# Patient Record
Sex: Male | Born: 1935 | Race: White | Hispanic: No | Smoking: Former smoker
Health system: Southern US, Community
[De-identification: ages and names within clinical notes are randomized; demographics above are authoritative.]

## PROBLEM LIST (undated history)

## (undated) DIAGNOSIS — I10 Essential (primary) hypertension: Secondary | ICD-10-CM

## (undated) DIAGNOSIS — E785 Hyperlipidemia, unspecified: Secondary | ICD-10-CM

## (undated) DIAGNOSIS — C61 Malignant neoplasm of prostate: Secondary | ICD-10-CM

## (undated) DIAGNOSIS — Z87442 Personal history of urinary calculi: Secondary | ICD-10-CM

## (undated) DIAGNOSIS — M1990 Unspecified osteoarthritis, unspecified site: Secondary | ICD-10-CM

## (undated) HISTORY — DX: Malignant neoplasm of prostate: C61

## (undated) HISTORY — DX: Hyperlipidemia, unspecified: E78.5

## (undated) HISTORY — DX: Unspecified osteoarthritis, unspecified site: M19.90

## (undated) HISTORY — DX: Essential (primary) hypertension: I10

---
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
## 2005-01-18 ENCOUNTER — Ambulatory Visit (HOSPITAL_COMMUNITY): Admission: RE | Admit: 2005-01-18 | Discharge: 2005-01-18 | Payer: Self-pay | Admitting: Ophthalmology

## 2005-03-01 ENCOUNTER — Ambulatory Visit (HOSPITAL_COMMUNITY): Admission: RE | Admit: 2005-03-01 | Discharge: 2005-03-01 | Payer: Self-pay | Admitting: Ophthalmology

## 2008-01-05 ENCOUNTER — Ambulatory Visit: Payer: Self-pay | Admitting: Internal Medicine

## 2008-01-30 ENCOUNTER — Encounter: Payer: Self-pay | Admitting: Internal Medicine

## 2008-01-30 ENCOUNTER — Ambulatory Visit: Payer: Self-pay | Admitting: Internal Medicine

## 2008-02-02 ENCOUNTER — Encounter: Payer: Self-pay | Admitting: Internal Medicine

## 2008-04-27 ENCOUNTER — Ambulatory Visit (HOSPITAL_COMMUNITY): Admission: RE | Admit: 2008-04-27 | Discharge: 2008-04-27 | Payer: Self-pay | Admitting: Urology

## 2008-05-12 ENCOUNTER — Ambulatory Visit: Admission: RE | Admit: 2008-05-12 | Discharge: 2008-08-03 | Payer: Self-pay | Admitting: Radiation Oncology

## 2008-08-03 ENCOUNTER — Ambulatory Visit: Admission: RE | Admit: 2008-08-03 | Discharge: 2008-10-27 | Payer: Self-pay | Admitting: Radiation Oncology

## 2009-02-19 DIAGNOSIS — C61 Malignant neoplasm of prostate: Secondary | ICD-10-CM

## 2009-02-19 HISTORY — PX: CARPAL TUNNEL RELEASE: SHX101

## 2009-02-19 HISTORY — DX: Malignant neoplasm of prostate: C61

## 2010-02-19 HISTORY — PX: COLONOSCOPY W/ BIOPSIES AND POLYPECTOMY: SHX1376

## 2010-04-21 ENCOUNTER — Encounter (INDEPENDENT_AMBULATORY_CARE_PROVIDER_SITE_OTHER): Payer: Self-pay | Admitting: *Deleted

## 2010-04-27 NOTE — Letter (Signed)
Summary: Pre Visit Letter Revised  [facility]  [address] [place], [place] [address]   Phone: [phone]  Fax: [phone]        04/21/2010 MRN: [id number] Levi Cervantes [address] [place], [place]  [address]             Procedure Date:  June 02, 2010   recall col Dr Marina Goodell  Welcome to the Gastroenterology Division at [facility].    You are scheduled to see a nurse for your pre-procedure visit on May 19, 2010 at 8:30am on the 3rd floor at [facility], [address]  We ask that you try to arrive at our office 15 minutes prior to your appointment time to allow for check-in.  Please take a minute to review the attached form.  If you answer "Yes" to one or more of the questions on the first page, we ask that you call the person listed at your earliest opportunity.  If you answer "No" to all of the questions, please complete the rest of the form and bring it to your appointment.    Your nurse visit will consist of discussing your medical and surgical history, your immediate family medical history, and your medications.   If you are unable to list all of your medications on the form, please bring the medication bottles to your appointment and we will list them.  We will need to be aware of both prescribed and over the counter drugs.  We will need to know exact dosage information as well.    Please be prepared to read and sign documents such as consent forms, a financial agreement, and acknowledgement forms.  If necessary, and with your consent, a friend or relative is welcome to sit-in on the nurse visit with you.  Please bring your insurance card so that we may make a copy of it.  If your insurance requires a referral to see a specialist, please bring your referral form from your primary care physician.  No co-pay is required for this nurse visit.     If you cannot keep your appointment, please call [phone] to cancel or reschedule prior to  your appointment date.  This allows [place] the opportunity to schedule an appointment for another patient in need of care.    Thank you for choosing [facility] for your medical needs.  We appreciate the opportunity to care for you.  Please visit [place] at our website  to learn more about our practice.  Sincerely, The Gastroenterology Division

## 2010-05-19 ENCOUNTER — Ambulatory Visit (AMBULATORY_SURGERY_CENTER): Payer: Medicare Other | Admitting: *Deleted

## 2010-05-19 VITALS — Ht 70.5 in | Wt 198.0 lb

## 2010-05-19 DIAGNOSIS — Z86010 Personal history of colonic polyps: Secondary | ICD-10-CM

## 2010-05-19 MED ORDER — PEG-KCL-NACL-NASULF-NA ASC-C 100 G PO SOLR: 1.0000 | Freq: Once | ORAL | Status: AC

## 2010-06-02 ENCOUNTER — Encounter: Payer: Self-pay | Admitting: Internal Medicine

## 2010-06-02 ENCOUNTER — Ambulatory Visit (AMBULATORY_SURGERY_CENTER): Payer: Medicare Other | Admitting: Internal Medicine

## 2010-06-02 VITALS — BP 142/74 | HR 49 | Temp 96.6°F | Resp 14 | Ht 70.0 in | Wt 190.0 lb

## 2010-06-02 DIAGNOSIS — D126 Benign neoplasm of colon, unspecified: Secondary | ICD-10-CM

## 2010-06-02 DIAGNOSIS — Z86010 Personal history of colonic polyps: Secondary | ICD-10-CM

## 2010-06-02 DIAGNOSIS — Z1211 Encounter for screening for malignant neoplasm of colon: Secondary | ICD-10-CM

## 2010-06-02 DIAGNOSIS — K5520 Angiodysplasia of colon without hemorrhage: Secondary | ICD-10-CM

## 2010-06-02 MED ORDER — SODIUM CHLORIDE 0.9 % IV SOLN: 500.0000 mL | INTRAVENOUS | Status: DC

## 2010-06-02 NOTE — Patient Instructions (Signed)
Resume medications. Follow discharge instructions. Next colonoscopy in 3 years.

## 2010-06-05 ENCOUNTER — Telehealth: Payer: Self-pay | Admitting: *Deleted

## 2010-06-05 NOTE — Telephone Encounter (Signed)

## 2011-04-12 DIAGNOSIS — Z125 Encounter for screening for malignant neoplasm of prostate: Secondary | ICD-10-CM | POA: Diagnosis not present

## 2011-04-12 DIAGNOSIS — E1129 Type 2 diabetes mellitus with other diabetic kidney complication: Secondary | ICD-10-CM | POA: Diagnosis not present

## 2011-04-12 DIAGNOSIS — E785 Hyperlipidemia, unspecified: Secondary | ICD-10-CM | POA: Diagnosis not present

## 2011-04-12 DIAGNOSIS — I10 Essential (primary) hypertension: Secondary | ICD-10-CM | POA: Diagnosis not present

## 2011-04-17 DIAGNOSIS — Z1212 Encounter for screening for malignant neoplasm of rectum: Secondary | ICD-10-CM | POA: Diagnosis not present

## 2011-04-19 DIAGNOSIS — E785 Hyperlipidemia, unspecified: Secondary | ICD-10-CM | POA: Diagnosis not present

## 2011-04-19 DIAGNOSIS — Z0000 Encounter for general adult medical examination without abnormal findings: Secondary | ICD-10-CM | POA: Diagnosis not present

## 2011-04-19 DIAGNOSIS — I10 Essential (primary) hypertension: Secondary | ICD-10-CM | POA: Diagnosis not present

## 2011-04-19 DIAGNOSIS — E1129 Type 2 diabetes mellitus with other diabetic kidney complication: Secondary | ICD-10-CM | POA: Diagnosis not present

## 2011-05-17 DIAGNOSIS — H259 Unspecified age-related cataract: Secondary | ICD-10-CM | POA: Diagnosis not present

## 2011-05-17 DIAGNOSIS — H40059 Ocular hypertension, unspecified eye: Secondary | ICD-10-CM | POA: Diagnosis not present

## 2011-05-17 DIAGNOSIS — D3130 Benign neoplasm of unspecified choroid: Secondary | ICD-10-CM | POA: Diagnosis not present

## 2011-07-02 DIAGNOSIS — N529 Male erectile dysfunction, unspecified: Secondary | ICD-10-CM | POA: Diagnosis not present

## 2011-07-02 DIAGNOSIS — C61 Malignant neoplasm of prostate: Secondary | ICD-10-CM | POA: Diagnosis not present

## 2011-10-01 DIAGNOSIS — C61 Malignant neoplasm of prostate: Secondary | ICD-10-CM | POA: Diagnosis not present

## 2011-10-01 DIAGNOSIS — N320 Bladder-neck obstruction: Secondary | ICD-10-CM | POA: Diagnosis not present

## 2011-10-17 DIAGNOSIS — E785 Hyperlipidemia, unspecified: Secondary | ICD-10-CM | POA: Diagnosis not present

## 2011-10-17 DIAGNOSIS — I10 Essential (primary) hypertension: Secondary | ICD-10-CM | POA: Diagnosis not present

## 2011-10-17 DIAGNOSIS — N182 Chronic kidney disease, stage 2 (mild): Secondary | ICD-10-CM | POA: Diagnosis not present

## 2011-10-17 DIAGNOSIS — E1129 Type 2 diabetes mellitus with other diabetic kidney complication: Secondary | ICD-10-CM | POA: Diagnosis not present

## 2011-11-15 DIAGNOSIS — H40019 Open angle with borderline findings, low risk, unspecified eye: Secondary | ICD-10-CM | POA: Diagnosis not present

## 2011-11-15 DIAGNOSIS — D3130 Benign neoplasm of unspecified choroid: Secondary | ICD-10-CM | POA: Diagnosis not present

## 2011-11-15 DIAGNOSIS — H40059 Ocular hypertension, unspecified eye: Secondary | ICD-10-CM | POA: Diagnosis not present

## 2011-11-15 DIAGNOSIS — H259 Unspecified age-related cataract: Secondary | ICD-10-CM | POA: Diagnosis not present

## 2011-11-27 DIAGNOSIS — E1129 Type 2 diabetes mellitus with other diabetic kidney complication: Secondary | ICD-10-CM | POA: Diagnosis not present

## 2011-11-27 DIAGNOSIS — I10 Essential (primary) hypertension: Secondary | ICD-10-CM | POA: Diagnosis not present

## 2011-12-31 DIAGNOSIS — R972 Elevated prostate specific antigen [PSA]: Secondary | ICD-10-CM | POA: Diagnosis not present

## 2012-04-15 DIAGNOSIS — E1129 Type 2 diabetes mellitus with other diabetic kidney complication: Secondary | ICD-10-CM | POA: Diagnosis not present

## 2012-04-15 DIAGNOSIS — E785 Hyperlipidemia, unspecified: Secondary | ICD-10-CM | POA: Diagnosis not present

## 2012-04-15 DIAGNOSIS — I10 Essential (primary) hypertension: Secondary | ICD-10-CM | POA: Diagnosis not present

## 2012-04-15 DIAGNOSIS — Z125 Encounter for screening for malignant neoplasm of prostate: Secondary | ICD-10-CM | POA: Diagnosis not present

## 2012-04-21 DIAGNOSIS — Z0000 Encounter for general adult medical examination without abnormal findings: Secondary | ICD-10-CM | POA: Diagnosis not present

## 2012-04-21 DIAGNOSIS — E1129 Type 2 diabetes mellitus with other diabetic kidney complication: Secondary | ICD-10-CM | POA: Diagnosis not present

## 2012-04-21 DIAGNOSIS — I10 Essential (primary) hypertension: Secondary | ICD-10-CM | POA: Diagnosis not present

## 2012-04-21 DIAGNOSIS — Z125 Encounter for screening for malignant neoplasm of prostate: Secondary | ICD-10-CM | POA: Diagnosis not present

## 2012-04-21 DIAGNOSIS — Z1331 Encounter for screening for depression: Secondary | ICD-10-CM | POA: Diagnosis not present

## 2012-04-23 DIAGNOSIS — Z1212 Encounter for screening for malignant neoplasm of rectum: Secondary | ICD-10-CM | POA: Diagnosis not present

## 2012-05-05 DIAGNOSIS — N529 Male erectile dysfunction, unspecified: Secondary | ICD-10-CM | POA: Diagnosis not present

## 2012-05-05 DIAGNOSIS — C61 Malignant neoplasm of prostate: Secondary | ICD-10-CM | POA: Diagnosis not present

## 2012-05-15 DIAGNOSIS — H40059 Ocular hypertension, unspecified eye: Secondary | ICD-10-CM | POA: Diagnosis not present

## 2012-05-15 DIAGNOSIS — H40019 Open angle with borderline findings, low risk, unspecified eye: Secondary | ICD-10-CM | POA: Diagnosis not present

## 2012-05-15 DIAGNOSIS — H259 Unspecified age-related cataract: Secondary | ICD-10-CM | POA: Diagnosis not present

## 2012-10-27 DIAGNOSIS — Z6831 Body mass index (BMI) 31.0-31.9, adult: Secondary | ICD-10-CM | POA: Diagnosis not present

## 2012-10-27 DIAGNOSIS — E785 Hyperlipidemia, unspecified: Secondary | ICD-10-CM | POA: Diagnosis not present

## 2012-10-27 DIAGNOSIS — E669 Obesity, unspecified: Secondary | ICD-10-CM | POA: Diagnosis not present

## 2012-10-27 DIAGNOSIS — N182 Chronic kidney disease, stage 2 (mild): Secondary | ICD-10-CM | POA: Diagnosis not present

## 2012-10-27 DIAGNOSIS — I10 Essential (primary) hypertension: Secondary | ICD-10-CM | POA: Diagnosis not present

## 2012-10-27 DIAGNOSIS — C61 Malignant neoplasm of prostate: Secondary | ICD-10-CM | POA: Diagnosis not present

## 2012-10-27 DIAGNOSIS — E1129 Type 2 diabetes mellitus with other diabetic kidney complication: Secondary | ICD-10-CM | POA: Diagnosis not present

## 2012-11-10 DIAGNOSIS — C61 Malignant neoplasm of prostate: Secondary | ICD-10-CM | POA: Diagnosis not present

## 2012-11-10 DIAGNOSIS — N320 Bladder-neck obstruction: Secondary | ICD-10-CM | POA: Diagnosis not present

## 2012-11-10 DIAGNOSIS — N529 Male erectile dysfunction, unspecified: Secondary | ICD-10-CM | POA: Diagnosis not present

## 2012-11-13 DIAGNOSIS — H40059 Ocular hypertension, unspecified eye: Secondary | ICD-10-CM | POA: Diagnosis not present

## 2012-11-13 DIAGNOSIS — H40019 Open angle with borderline findings, low risk, unspecified eye: Secondary | ICD-10-CM | POA: Diagnosis not present

## 2012-11-13 DIAGNOSIS — H259 Unspecified age-related cataract: Secondary | ICD-10-CM | POA: Diagnosis not present

## 2012-11-13 DIAGNOSIS — D3130 Benign neoplasm of unspecified choroid: Secondary | ICD-10-CM | POA: Diagnosis not present

## 2013-01-08 DIAGNOSIS — D3130 Benign neoplasm of unspecified choroid: Secondary | ICD-10-CM | POA: Diagnosis not present

## 2013-01-08 DIAGNOSIS — H4020X0 Unspecified primary angle-closure glaucoma, stage unspecified: Secondary | ICD-10-CM | POA: Diagnosis not present

## 2013-01-08 DIAGNOSIS — H409 Unspecified glaucoma: Secondary | ICD-10-CM | POA: Diagnosis not present

## 2013-01-08 DIAGNOSIS — H2510 Age-related nuclear cataract, unspecified eye: Secondary | ICD-10-CM | POA: Diagnosis not present

## 2013-05-07 DIAGNOSIS — H40249 Residual stage of angle-closure glaucoma, unspecified eye: Secondary | ICD-10-CM | POA: Diagnosis not present

## 2013-05-07 DIAGNOSIS — H259 Unspecified age-related cataract: Secondary | ICD-10-CM | POA: Diagnosis not present

## 2013-05-07 DIAGNOSIS — H5200 Hypermetropia, unspecified eye: Secondary | ICD-10-CM | POA: Diagnosis not present

## 2013-05-07 DIAGNOSIS — H40059 Ocular hypertension, unspecified eye: Secondary | ICD-10-CM | POA: Diagnosis not present

## 2013-05-08 DIAGNOSIS — C61 Malignant neoplasm of prostate: Secondary | ICD-10-CM | POA: Diagnosis not present

## 2013-05-08 DIAGNOSIS — E785 Hyperlipidemia, unspecified: Secondary | ICD-10-CM | POA: Diagnosis not present

## 2013-05-08 DIAGNOSIS — Z125 Encounter for screening for malignant neoplasm of prostate: Secondary | ICD-10-CM | POA: Diagnosis not present

## 2013-05-08 DIAGNOSIS — E1129 Type 2 diabetes mellitus with other diabetic kidney complication: Secondary | ICD-10-CM | POA: Diagnosis not present

## 2013-05-08 DIAGNOSIS — I10 Essential (primary) hypertension: Secondary | ICD-10-CM | POA: Diagnosis not present

## 2013-05-15 DIAGNOSIS — E669 Obesity, unspecified: Secondary | ICD-10-CM | POA: Diagnosis not present

## 2013-05-15 DIAGNOSIS — N182 Chronic kidney disease, stage 2 (mild): Secondary | ICD-10-CM | POA: Diagnosis not present

## 2013-05-15 DIAGNOSIS — E1129 Type 2 diabetes mellitus with other diabetic kidney complication: Secondary | ICD-10-CM | POA: Diagnosis not present

## 2013-05-15 DIAGNOSIS — I10 Essential (primary) hypertension: Secondary | ICD-10-CM | POA: Diagnosis not present

## 2013-05-15 DIAGNOSIS — E785 Hyperlipidemia, unspecified: Secondary | ICD-10-CM | POA: Diagnosis not present

## 2013-05-15 DIAGNOSIS — D126 Benign neoplasm of colon, unspecified: Secondary | ICD-10-CM | POA: Diagnosis not present

## 2013-05-15 DIAGNOSIS — C61 Malignant neoplasm of prostate: Secondary | ICD-10-CM | POA: Diagnosis not present

## 2013-05-15 DIAGNOSIS — Z0000 Encounter for general adult medical examination without abnormal findings: Secondary | ICD-10-CM | POA: Diagnosis not present

## 2013-05-18 DIAGNOSIS — R972 Elevated prostate specific antigen [PSA]: Secondary | ICD-10-CM | POA: Diagnosis not present

## 2013-05-18 DIAGNOSIS — Z1212 Encounter for screening for malignant neoplasm of rectum: Secondary | ICD-10-CM | POA: Diagnosis not present

## 2013-05-18 DIAGNOSIS — C61 Malignant neoplasm of prostate: Secondary | ICD-10-CM | POA: Diagnosis not present

## 2013-05-18 DIAGNOSIS — N320 Bladder-neck obstruction: Secondary | ICD-10-CM | POA: Diagnosis not present

## 2013-05-28 ENCOUNTER — Encounter: Payer: Self-pay | Admitting: Internal Medicine

## 2013-06-02 ENCOUNTER — Encounter: Payer: Self-pay | Admitting: Internal Medicine

## 2013-06-09 DIAGNOSIS — Z23 Encounter for immunization: Secondary | ICD-10-CM | POA: Diagnosis not present

## 2013-07-14 ENCOUNTER — Ambulatory Visit (AMBULATORY_SURGERY_CENTER): Payer: Self-pay | Admitting: *Deleted

## 2013-07-14 VITALS — Ht 71.0 in | Wt 219.8 lb

## 2013-07-14 DIAGNOSIS — Z860100 Personal history of colon polyps, unspecified: Secondary | ICD-10-CM

## 2013-07-14 MED ORDER — MOVIPREP 100 G PO SOLR: ORAL | Status: DC

## 2013-07-14 NOTE — Progress Notes (Signed)
No allergies to eggs or soy. No problems with anesthesia.  Pt given Emmi instructions for colonoscopy  No oxygen use  No diet drug use  

## 2013-07-28 ENCOUNTER — Ambulatory Visit (AMBULATORY_SURGERY_CENTER): Payer: Medicare Other | Admitting: Internal Medicine

## 2013-07-28 ENCOUNTER — Encounter: Payer: Self-pay | Admitting: Internal Medicine

## 2013-07-28 VITALS — BP 123/79 | HR 55 | Temp 96.8°F | Resp 9 | Ht 71.0 in | Wt 219.0 lb

## 2013-07-28 DIAGNOSIS — Z860100 Personal history of colon polyps, unspecified: Secondary | ICD-10-CM

## 2013-07-28 DIAGNOSIS — D126 Benign neoplasm of colon, unspecified: Secondary | ICD-10-CM | POA: Diagnosis not present

## 2013-07-28 DIAGNOSIS — I10 Essential (primary) hypertension: Secondary | ICD-10-CM | POA: Diagnosis not present

## 2013-07-28 MED ORDER — SODIUM CHLORIDE 0.9 % IV SOLN: 500.0000 mL | INTRAVENOUS | Status: DC

## 2013-07-28 NOTE — Progress Notes (Signed)
Called to room to assist during endoscopic procedure.  Patient ID and intended procedure confirmed with present staff. Received instructions for my participation in the procedure from the performing physician.  

## 2013-07-28 NOTE — Patient Instructions (Signed)
YOU HAD AN ENDOSCOPIC PROCEDURE TODAY AT THE [facility]: Refer to the procedure report that was given to you for any specific questions about what was found during the examination.  If the procedure report does not answer your questions, please call your gastroenterologist to clarify.  If you requested that your care partner not be given the details of your procedure findings, then the procedure report has been included in a sealed envelope for you to review at your convenience later.  YOU SHOULD EXPECT: Some feelings of bloating in the abdomen. Passage of more gas than usual.  Walking can help get rid of the air that was put into your GI tract during the procedure and reduce the bloating. If you had a lower endoscopy (such as a colonoscopy or flexible sigmoidoscopy) you may notice spotting of blood in your stool or on the toilet paper. If you underwent a bowel prep for your procedure, then you may not have a normal bowel movement for a few days.  DIET: Your first meal following the procedure should be a light meal and then it is ok to progress to your normal diet.  A half-sandwich or bowl of soup is an example of a good first meal.  Heavy or fried foods are harder to digest and may make you feel nauseous or bloated.  Likewise meals heavy in dairy and vegetables can cause extra gas to form and this can also increase the bloating.  Drink plenty of fluids but you should avoid alcoholic beverages for 24 hours.  ACTIVITY: Your care partner should take you home directly after the procedure.  You should plan to take it easy, moving slowly for the rest of the day.  You can resume normal activity the day after the procedure however you should NOT DRIVE or use heavy machinery for 24 hours (because of the sedation medicines used during the test).    SYMPTOMS TO REPORT IMMEDIATELY: A gastroenterologist can be reached at any hour.  During normal business hours, 8:30 AM to 5:00 PM Monday through Friday,  call [phone].  After hours and on weekends, please call the GI answering service at [phone] who will take a message and have the physician on call contact you.   Following lower endoscopy (colonoscopy or flexible sigmoidoscopy):  Excessive amounts of blood in the stool  Significant tenderness or worsening of abdominal pains  Swelling of the abdomen that is new, acute  Fever of 100F or higher  FOLLOW UP: If any biopsies were taken you will be contacted by phone or by letter within the next 1-3 weeks.  Call your gastroenterologist if you have not heard about the biopsies in 3 weeks.  Our staff will call the home number listed on your records the next business day following your procedure to check on you and address any questions or concerns that you may have at that time regarding the information given to you following your procedure. This is a courtesy call and so if there is no answer at the home number and we have not heard from you through the emergency physician on call, we will assume that you have returned to your regular daily activities without incident.  SIGNATURES/CONFIDENTIALITY: You and/or your care partner have signed paperwork which will be entered into your electronic medical record.  These signatures attest to the fact that that the information above on your After Visit Summary has been reviewed and is understood.  Full responsibility of the confidentiality of this   discharge information lies with you and/or your care-partner.  Recommendations Return to care of your primary provider. GI follow up as needed.

## 2013-07-28 NOTE — Op Note (Signed)
[facility]  [address] [place], [address]   COLONOSCOPY PROCEDURE REPORT  PATIENT: Levi Cervantes, Levi Cervantes  MR#: [id number] BIRTHDATE: 03-Sep-1931 , 81  yrs. old GENDER: Male ENDOSCOPIST: Eustace Quail, MD REFERRED BM:[id number] Program Recall PROCEDURE DATE:  07/28/2013 PROCEDURE:   Colonoscopy with snare polypectomy x 2 First Screening Colonoscopy - Avg.  risk and is 50 yrs.  old or older - No.  Prior Negative Screening - Now for repeat screening. N/A  History of Adenoma - Now for follow-up colonoscopy & has been > or = to 3 yrs.  Yes hx of adenoma.  Has been 3 or more years since last colonoscopy.  Polyps Removed Today? Yes. ASA CLASS:   Class II INDICATIONS:Patient's personal history of adenomatous colon polyps - index 2009 >10;   F/U 2012 (4). MEDICATIONS: MAC sedation, administered by CRNA and propofol (Diprivan) 200mg  IV DESCRIPTION OF PROCEDURE:   After the risks benefits and alternatives of the procedure were thoroughly explained, informed consent was obtained.  A digital rectal exam revealed no abnormalities of the rectum.   The LB [id number]  endoscope was introduced through the anus and advanced to the cecum, which was identified by both the appendix and ileocecal valve. No adverse events experienced.   The quality of the prep was good, using MoviPrep  The instrument was then slowly withdrawn as the colon was fully examined.  COLON FINDINGS: Two diminutive polyps were found at the cecum and ascending colon.  A polypectomy was performed with a cold snare. The resection was complete and the polyp tissue was completely retrieved.   Nonbleeding AVM's were noted at the cecum.   Radiation Proctitis was present.   The colon mucosa was otherwise normal. Retroflexed views revealed internal hemorrhoids. The time to cecum=4 minutes 32 seconds.  Withdrawal time=14 minutes 24 seconds. The scope was withdrawn and the procedure completed. COMPLICATIONS:  There were no complications.  ENDOSCOPIC IMPRESSION: 1.   Two diminutive polyps were found in the colon; polypectomy was performed with a cold snare 2.   Cecal AVM's 3.   Radiation Proctitis 4.   The colon mucosa was otherwise normal  RECOMMENDATIONS: 1. Return to the care of your primary provider.  GI follow up as needed   eSigned:  Eustace Quail, MD 07/28/2013 9:20 AM   cc: Janalyn Rouse, MD and The Patient

## 2013-07-29 ENCOUNTER — Telehealth: Payer: Self-pay | Admitting: *Deleted

## 2013-07-29 NOTE — Telephone Encounter (Signed)
  Follow up Call-  Call back number 07/28/2013  Post procedure Call Back phone  # [phone]  Permission to leave phone message Yes     Patient questions:  Do you have a fever, pain , or abdominal swelling? no Pain Score  0 *  Have you tolerated food without any problems? yes  Have you been able to return to your normal activities? yes  Do you have any questions about your discharge instructions: Diet   no Medications  no Follow up visit  no  Do you have questions or concerns about your Care? no  Actions: * If pain score is 4 or above: No action needed, pain <4.

## 2013-08-03 ENCOUNTER — Encounter: Payer: Self-pay | Admitting: Internal Medicine

## 2013-08-17 DIAGNOSIS — C61 Malignant neoplasm of prostate: Secondary | ICD-10-CM | POA: Diagnosis not present

## 2013-11-13 DIAGNOSIS — H40059 Ocular hypertension, unspecified eye: Secondary | ICD-10-CM | POA: Diagnosis not present

## 2013-11-13 DIAGNOSIS — H259 Unspecified age-related cataract: Secondary | ICD-10-CM | POA: Diagnosis not present

## 2013-11-13 DIAGNOSIS — H40019 Open angle with borderline findings, low risk, unspecified eye: Secondary | ICD-10-CM | POA: Diagnosis not present

## 2013-11-23 DIAGNOSIS — N320 Bladder-neck obstruction: Secondary | ICD-10-CM | POA: Diagnosis not present

## 2013-11-23 DIAGNOSIS — H269 Unspecified cataract: Secondary | ICD-10-CM | POA: Diagnosis not present

## 2013-11-23 DIAGNOSIS — C61 Malignant neoplasm of prostate: Secondary | ICD-10-CM | POA: Diagnosis not present

## 2013-11-23 DIAGNOSIS — R972 Elevated prostate specific antigen [PSA]: Secondary | ICD-10-CM | POA: Diagnosis not present

## 2013-11-23 DIAGNOSIS — N528 Other male erectile dysfunction: Secondary | ICD-10-CM | POA: Diagnosis not present

## 2013-11-23 DIAGNOSIS — C801 Malignant (primary) neoplasm, unspecified: Secondary | ICD-10-CM | POA: Diagnosis not present

## 2013-11-23 DIAGNOSIS — D3131 Benign neoplasm of right choroid: Secondary | ICD-10-CM | POA: Diagnosis not present

## 2013-11-23 DIAGNOSIS — N529 Male erectile dysfunction, unspecified: Secondary | ICD-10-CM | POA: Diagnosis not present

## 2013-11-23 DIAGNOSIS — H4020X0 Unspecified primary angle-closure glaucoma, stage unspecified: Secondary | ICD-10-CM | POA: Diagnosis not present

## 2014-02-22 DIAGNOSIS — C61 Malignant neoplasm of prostate: Secondary | ICD-10-CM | POA: Diagnosis not present

## 2014-02-22 DIAGNOSIS — R972 Elevated prostate specific antigen [PSA]: Secondary | ICD-10-CM | POA: Diagnosis not present

## 2014-05-05 DIAGNOSIS — H40013 Open angle with borderline findings, low risk, bilateral: Secondary | ICD-10-CM | POA: Diagnosis not present

## 2014-05-05 DIAGNOSIS — D3131 Benign neoplasm of right choroid: Secondary | ICD-10-CM | POA: Diagnosis not present

## 2014-05-05 DIAGNOSIS — H40053 Ocular hypertension, bilateral: Secondary | ICD-10-CM | POA: Diagnosis not present

## 2014-05-05 DIAGNOSIS — H2513 Age-related nuclear cataract, bilateral: Secondary | ICD-10-CM | POA: Diagnosis not present

## 2014-05-20 DIAGNOSIS — E785 Hyperlipidemia, unspecified: Secondary | ICD-10-CM | POA: Diagnosis not present

## 2014-05-20 DIAGNOSIS — Z0000 Encounter for general adult medical examination without abnormal findings: Secondary | ICD-10-CM | POA: Diagnosis not present

## 2014-05-20 DIAGNOSIS — Z125 Encounter for screening for malignant neoplasm of prostate: Secondary | ICD-10-CM | POA: Diagnosis not present

## 2014-05-20 DIAGNOSIS — E1129 Type 2 diabetes mellitus with other diabetic kidney complication: Secondary | ICD-10-CM | POA: Diagnosis not present

## 2014-05-24 DIAGNOSIS — N320 Bladder-neck obstruction: Secondary | ICD-10-CM | POA: Diagnosis not present

## 2014-05-24 DIAGNOSIS — C61 Malignant neoplasm of prostate: Secondary | ICD-10-CM | POA: Diagnosis not present

## 2014-05-24 DIAGNOSIS — N528 Other male erectile dysfunction: Secondary | ICD-10-CM | POA: Diagnosis not present

## 2014-05-24 DIAGNOSIS — R972 Elevated prostate specific antigen [PSA]: Secondary | ICD-10-CM | POA: Diagnosis not present

## 2014-05-27 DIAGNOSIS — E1129 Type 2 diabetes mellitus with other diabetic kidney complication: Secondary | ICD-10-CM | POA: Diagnosis not present

## 2014-05-27 DIAGNOSIS — E785 Hyperlipidemia, unspecified: Secondary | ICD-10-CM | POA: Diagnosis not present

## 2014-05-27 DIAGNOSIS — Z0000 Encounter for general adult medical examination without abnormal findings: Secondary | ICD-10-CM | POA: Diagnosis not present

## 2014-05-27 DIAGNOSIS — E669 Obesity, unspecified: Secondary | ICD-10-CM | POA: Diagnosis not present

## 2014-05-27 DIAGNOSIS — M19041 Primary osteoarthritis, right hand: Secondary | ICD-10-CM | POA: Diagnosis not present

## 2014-05-27 DIAGNOSIS — Z1389 Encounter for screening for other disorder: Secondary | ICD-10-CM | POA: Diagnosis not present

## 2014-05-27 DIAGNOSIS — Z86010 Personal history of colonic polyps: Secondary | ICD-10-CM | POA: Diagnosis not present

## 2014-05-27 DIAGNOSIS — C61 Malignant neoplasm of prostate: Secondary | ICD-10-CM | POA: Diagnosis not present

## 2014-05-27 DIAGNOSIS — N182 Chronic kidney disease, stage 2 (mild): Secondary | ICD-10-CM | POA: Diagnosis not present

## 2014-05-27 DIAGNOSIS — Z6831 Body mass index (BMI) 31.0-31.9, adult: Secondary | ICD-10-CM | POA: Diagnosis not present

## 2014-05-27 DIAGNOSIS — I10 Essential (primary) hypertension: Secondary | ICD-10-CM | POA: Diagnosis not present

## 2014-05-27 DIAGNOSIS — L723 Sebaceous cyst: Secondary | ICD-10-CM | POA: Diagnosis not present

## 2014-06-02 ENCOUNTER — Other Ambulatory Visit: Payer: Self-pay | Admitting: Dermatology

## 2014-06-02 DIAGNOSIS — L720 Epidermal cyst: Secondary | ICD-10-CM | POA: Diagnosis not present

## 2014-07-20 ENCOUNTER — Encounter: Payer: Self-pay | Admitting: Internal Medicine

## 2014-11-04 DIAGNOSIS — D3131 Benign neoplasm of right choroid: Secondary | ICD-10-CM | POA: Diagnosis not present

## 2014-11-04 DIAGNOSIS — H40053 Ocular hypertension, bilateral: Secondary | ICD-10-CM | POA: Diagnosis not present

## 2014-11-04 DIAGNOSIS — H25813 Combined forms of age-related cataract, bilateral: Secondary | ICD-10-CM | POA: Diagnosis not present

## 2014-11-04 DIAGNOSIS — H524 Presbyopia: Secondary | ICD-10-CM | POA: Diagnosis not present

## 2014-11-29 DIAGNOSIS — N529 Male erectile dysfunction, unspecified: Secondary | ICD-10-CM | POA: Diagnosis not present

## 2014-11-29 DIAGNOSIS — C61 Malignant neoplasm of prostate: Secondary | ICD-10-CM | POA: Diagnosis not present

## 2014-11-29 DIAGNOSIS — N320 Bladder-neck obstruction: Secondary | ICD-10-CM | POA: Diagnosis not present

## 2014-11-29 DIAGNOSIS — R9721 Rising PSA following treatment for malignant neoplasm of prostate: Secondary | ICD-10-CM | POA: Diagnosis not present

## 2014-11-29 DIAGNOSIS — R972 Elevated prostate specific antigen [PSA]: Secondary | ICD-10-CM | POA: Diagnosis not present

## 2014-11-30 DIAGNOSIS — E785 Hyperlipidemia, unspecified: Secondary | ICD-10-CM | POA: Diagnosis not present

## 2014-11-30 DIAGNOSIS — E1129 Type 2 diabetes mellitus with other diabetic kidney complication: Secondary | ICD-10-CM | POA: Diagnosis not present

## 2014-11-30 DIAGNOSIS — E669 Obesity, unspecified: Secondary | ICD-10-CM | POA: Diagnosis not present

## 2014-11-30 DIAGNOSIS — I10 Essential (primary) hypertension: Secondary | ICD-10-CM | POA: Diagnosis not present

## 2014-11-30 DIAGNOSIS — Z6829 Body mass index (BMI) 29.0-29.9, adult: Secondary | ICD-10-CM | POA: Diagnosis not present

## 2014-11-30 DIAGNOSIS — C61 Malignant neoplasm of prostate: Secondary | ICD-10-CM | POA: Diagnosis not present

## 2015-03-12 DIAGNOSIS — B028 Zoster with other complications: Secondary | ICD-10-CM | POA: Diagnosis not present

## 2015-03-12 DIAGNOSIS — L308 Other specified dermatitis: Secondary | ICD-10-CM | POA: Diagnosis not present

## 2015-04-29 DIAGNOSIS — H25813 Combined forms of age-related cataract, bilateral: Secondary | ICD-10-CM | POA: Diagnosis not present

## 2015-04-29 DIAGNOSIS — H40051 Ocular hypertension, right eye: Secondary | ICD-10-CM | POA: Diagnosis not present

## 2015-04-29 DIAGNOSIS — H40052 Ocular hypertension, left eye: Secondary | ICD-10-CM | POA: Diagnosis not present

## 2015-04-29 DIAGNOSIS — D3131 Benign neoplasm of right choroid: Secondary | ICD-10-CM | POA: Diagnosis not present

## 2015-05-30 DIAGNOSIS — R972 Elevated prostate specific antigen [PSA]: Secondary | ICD-10-CM | POA: Diagnosis not present

## 2015-05-30 DIAGNOSIS — C61 Malignant neoplasm of prostate: Secondary | ICD-10-CM | POA: Diagnosis not present

## 2015-05-30 DIAGNOSIS — N320 Bladder-neck obstruction: Secondary | ICD-10-CM | POA: Diagnosis not present

## 2015-05-30 DIAGNOSIS — R9721 Rising PSA following treatment for malignant neoplasm of prostate: Secondary | ICD-10-CM | POA: Diagnosis not present

## 2015-05-30 DIAGNOSIS — N529 Male erectile dysfunction, unspecified: Secondary | ICD-10-CM | POA: Diagnosis not present

## 2015-06-27 DIAGNOSIS — I10 Essential (primary) hypertension: Secondary | ICD-10-CM | POA: Diagnosis not present

## 2015-06-27 DIAGNOSIS — E784 Other hyperlipidemia: Secondary | ICD-10-CM | POA: Diagnosis not present

## 2015-06-27 DIAGNOSIS — E1129 Type 2 diabetes mellitus with other diabetic kidney complication: Secondary | ICD-10-CM | POA: Diagnosis not present

## 2015-07-06 DIAGNOSIS — C61 Malignant neoplasm of prostate: Secondary | ICD-10-CM | POA: Diagnosis not present

## 2015-07-06 DIAGNOSIS — E668 Other obesity: Secondary | ICD-10-CM | POA: Diagnosis not present

## 2015-07-06 DIAGNOSIS — I10 Essential (primary) hypertension: Secondary | ICD-10-CM | POA: Diagnosis not present

## 2015-07-06 DIAGNOSIS — N182 Chronic kidney disease, stage 2 (mild): Secondary | ICD-10-CM | POA: Diagnosis not present

## 2015-07-06 DIAGNOSIS — Z86010 Personal history of colonic polyps: Secondary | ICD-10-CM | POA: Diagnosis not present

## 2015-07-06 DIAGNOSIS — Z1389 Encounter for screening for other disorder: Secondary | ICD-10-CM | POA: Diagnosis not present

## 2015-07-06 DIAGNOSIS — E784 Other hyperlipidemia: Secondary | ICD-10-CM | POA: Diagnosis not present

## 2015-07-06 DIAGNOSIS — Z0000 Encounter for general adult medical examination without abnormal findings: Secondary | ICD-10-CM | POA: Diagnosis not present

## 2015-07-06 DIAGNOSIS — E1129 Type 2 diabetes mellitus with other diabetic kidney complication: Secondary | ICD-10-CM | POA: Diagnosis not present

## 2015-07-06 DIAGNOSIS — Z6829 Body mass index (BMI) 29.0-29.9, adult: Secondary | ICD-10-CM | POA: Diagnosis not present

## 2015-07-06 DIAGNOSIS — M19041 Primary osteoarthritis, right hand: Secondary | ICD-10-CM | POA: Diagnosis not present

## 2015-09-26 DIAGNOSIS — C61 Malignant neoplasm of prostate: Secondary | ICD-10-CM | POA: Diagnosis not present

## 2015-11-04 DIAGNOSIS — R9721 Rising PSA following treatment for malignant neoplasm of prostate: Secondary | ICD-10-CM | POA: Diagnosis not present

## 2015-11-04 DIAGNOSIS — N528 Other male erectile dysfunction: Secondary | ICD-10-CM | POA: Diagnosis not present

## 2015-11-04 DIAGNOSIS — N320 Bladder-neck obstruction: Secondary | ICD-10-CM | POA: Diagnosis not present

## 2015-11-21 DIAGNOSIS — H40243 Residual stage of angle-closure glaucoma, bilateral: Secondary | ICD-10-CM | POA: Diagnosis not present

## 2015-11-21 DIAGNOSIS — H40053 Ocular hypertension, bilateral: Secondary | ICD-10-CM | POA: Diagnosis not present

## 2015-11-21 DIAGNOSIS — H25813 Combined forms of age-related cataract, bilateral: Secondary | ICD-10-CM | POA: Diagnosis not present

## 2016-01-06 DIAGNOSIS — E1129 Type 2 diabetes mellitus with other diabetic kidney complication: Secondary | ICD-10-CM | POA: Diagnosis not present

## 2016-01-06 DIAGNOSIS — Z6831 Body mass index (BMI) 31.0-31.9, adult: Secondary | ICD-10-CM | POA: Diagnosis not present

## 2016-01-06 DIAGNOSIS — E784 Other hyperlipidemia: Secondary | ICD-10-CM | POA: Diagnosis not present

## 2016-01-06 DIAGNOSIS — E668 Other obesity: Secondary | ICD-10-CM | POA: Diagnosis not present

## 2016-01-06 DIAGNOSIS — N182 Chronic kidney disease, stage 2 (mild): Secondary | ICD-10-CM | POA: Diagnosis not present

## 2016-01-06 DIAGNOSIS — I10 Essential (primary) hypertension: Secondary | ICD-10-CM | POA: Diagnosis not present

## 2016-05-04 DIAGNOSIS — R972 Elevated prostate specific antigen [PSA]: Secondary | ICD-10-CM | POA: Diagnosis not present

## 2016-05-04 DIAGNOSIS — N320 Bladder-neck obstruction: Secondary | ICD-10-CM | POA: Diagnosis not present

## 2016-05-04 DIAGNOSIS — C61 Malignant neoplasm of prostate: Secondary | ICD-10-CM | POA: Diagnosis not present

## 2016-05-04 DIAGNOSIS — N528 Other male erectile dysfunction: Secondary | ICD-10-CM | POA: Diagnosis not present

## 2016-05-21 DIAGNOSIS — H40053 Ocular hypertension, bilateral: Secondary | ICD-10-CM | POA: Diagnosis not present

## 2016-06-04 DIAGNOSIS — H25813 Combined forms of age-related cataract, bilateral: Secondary | ICD-10-CM | POA: Diagnosis not present

## 2016-06-04 DIAGNOSIS — H40013 Open angle with borderline findings, low risk, bilateral: Secondary | ICD-10-CM | POA: Diagnosis not present

## 2016-06-04 DIAGNOSIS — D3131 Benign neoplasm of right choroid: Secondary | ICD-10-CM | POA: Diagnosis not present

## 2016-06-04 DIAGNOSIS — H52203 Unspecified astigmatism, bilateral: Secondary | ICD-10-CM | POA: Diagnosis not present

## 2016-07-04 DIAGNOSIS — I10 Essential (primary) hypertension: Secondary | ICD-10-CM | POA: Diagnosis not present

## 2016-07-04 DIAGNOSIS — E784 Other hyperlipidemia: Secondary | ICD-10-CM | POA: Diagnosis not present

## 2016-07-04 DIAGNOSIS — E1129 Type 2 diabetes mellitus with other diabetic kidney complication: Secondary | ICD-10-CM | POA: Diagnosis not present

## 2016-07-04 DIAGNOSIS — R8299 Other abnormal findings in urine: Secondary | ICD-10-CM | POA: Diagnosis not present

## 2016-07-11 DIAGNOSIS — Z1389 Encounter for screening for other disorder: Secondary | ICD-10-CM | POA: Diagnosis not present

## 2016-07-11 DIAGNOSIS — Z0000 Encounter for general adult medical examination without abnormal findings: Secondary | ICD-10-CM | POA: Diagnosis not present

## 2016-07-11 DIAGNOSIS — Z6831 Body mass index (BMI) 31.0-31.9, adult: Secondary | ICD-10-CM | POA: Diagnosis not present

## 2016-07-11 DIAGNOSIS — E668 Other obesity: Secondary | ICD-10-CM | POA: Diagnosis not present

## 2016-07-11 DIAGNOSIS — E784 Other hyperlipidemia: Secondary | ICD-10-CM | POA: Diagnosis not present

## 2016-07-11 DIAGNOSIS — M19041 Primary osteoarthritis, right hand: Secondary | ICD-10-CM | POA: Diagnosis not present

## 2016-07-11 DIAGNOSIS — N182 Chronic kidney disease, stage 2 (mild): Secondary | ICD-10-CM | POA: Diagnosis not present

## 2016-07-11 DIAGNOSIS — C61 Malignant neoplasm of prostate: Secondary | ICD-10-CM | POA: Diagnosis not present

## 2016-07-11 DIAGNOSIS — E1129 Type 2 diabetes mellitus with other diabetic kidney complication: Secondary | ICD-10-CM | POA: Diagnosis not present

## 2016-07-11 DIAGNOSIS — I10 Essential (primary) hypertension: Secondary | ICD-10-CM | POA: Diagnosis not present

## 2016-07-11 DIAGNOSIS — L988 Other specified disorders of the skin and subcutaneous tissue: Secondary | ICD-10-CM | POA: Diagnosis not present

## 2016-07-11 DIAGNOSIS — G5602 Carpal tunnel syndrome, left upper limb: Secondary | ICD-10-CM | POA: Diagnosis not present

## 2016-07-13 DIAGNOSIS — Z85828 Personal history of other malignant neoplasm of skin: Secondary | ICD-10-CM | POA: Diagnosis not present

## 2016-07-13 DIAGNOSIS — C44629 Squamous cell carcinoma of skin of left upper limb, including shoulder: Secondary | ICD-10-CM | POA: Diagnosis not present

## 2016-07-13 DIAGNOSIS — L570 Actinic keratosis: Secondary | ICD-10-CM | POA: Diagnosis not present

## 2016-07-13 DIAGNOSIS — C44319 Basal cell carcinoma of skin of other parts of face: Secondary | ICD-10-CM | POA: Diagnosis not present

## 2016-07-23 DIAGNOSIS — C44319 Basal cell carcinoma of skin of other parts of face: Secondary | ICD-10-CM | POA: Diagnosis not present

## 2016-07-23 DIAGNOSIS — Z85828 Personal history of other malignant neoplasm of skin: Secondary | ICD-10-CM | POA: Diagnosis not present

## 2016-08-06 DIAGNOSIS — C61 Malignant neoplasm of prostate: Secondary | ICD-10-CM | POA: Diagnosis not present

## 2016-08-06 DIAGNOSIS — R972 Elevated prostate specific antigen [PSA]: Secondary | ICD-10-CM | POA: Diagnosis not present

## 2016-08-27 DIAGNOSIS — D0422 Carcinoma in situ of skin of left ear and external auricular canal: Secondary | ICD-10-CM | POA: Diagnosis not present

## 2016-08-27 DIAGNOSIS — Z85828 Personal history of other malignant neoplasm of skin: Secondary | ICD-10-CM | POA: Diagnosis not present

## 2016-11-02 DIAGNOSIS — C61 Malignant neoplasm of prostate: Secondary | ICD-10-CM | POA: Diagnosis not present

## 2016-11-02 DIAGNOSIS — R9721 Rising PSA following treatment for malignant neoplasm of prostate: Secondary | ICD-10-CM | POA: Diagnosis not present

## 2016-11-02 DIAGNOSIS — N528 Other male erectile dysfunction: Secondary | ICD-10-CM | POA: Diagnosis not present

## 2016-11-02 DIAGNOSIS — N320 Bladder-neck obstruction: Secondary | ICD-10-CM | POA: Diagnosis not present

## 2016-11-02 DIAGNOSIS — R972 Elevated prostate specific antigen [PSA]: Secondary | ICD-10-CM | POA: Diagnosis not present

## 2016-12-13 DIAGNOSIS — H04123 Dry eye syndrome of bilateral lacrimal glands: Secondary | ICD-10-CM | POA: Diagnosis not present

## 2016-12-13 DIAGNOSIS — H40053 Ocular hypertension, bilateral: Secondary | ICD-10-CM | POA: Diagnosis not present

## 2016-12-13 DIAGNOSIS — H25813 Combined forms of age-related cataract, bilateral: Secondary | ICD-10-CM | POA: Diagnosis not present

## 2017-01-18 DIAGNOSIS — D1801 Hemangioma of skin and subcutaneous tissue: Secondary | ICD-10-CM | POA: Diagnosis not present

## 2017-01-18 DIAGNOSIS — D225 Melanocytic nevi of trunk: Secondary | ICD-10-CM | POA: Diagnosis not present

## 2017-01-18 DIAGNOSIS — D0462 Carcinoma in situ of skin of left upper limb, including shoulder: Secondary | ICD-10-CM | POA: Diagnosis not present

## 2017-01-18 DIAGNOSIS — L570 Actinic keratosis: Secondary | ICD-10-CM | POA: Diagnosis not present

## 2017-01-18 DIAGNOSIS — C44319 Basal cell carcinoma of skin of other parts of face: Secondary | ICD-10-CM | POA: Diagnosis not present

## 2017-01-18 DIAGNOSIS — L814 Other melanin hyperpigmentation: Secondary | ICD-10-CM | POA: Diagnosis not present

## 2017-01-18 DIAGNOSIS — L821 Other seborrheic keratosis: Secondary | ICD-10-CM | POA: Diagnosis not present

## 2017-01-18 DIAGNOSIS — C44612 Basal cell carcinoma of skin of right upper limb, including shoulder: Secondary | ICD-10-CM | POA: Diagnosis not present

## 2017-01-18 DIAGNOSIS — Z85828 Personal history of other malignant neoplasm of skin: Secondary | ICD-10-CM | POA: Diagnosis not present

## 2017-02-04 DIAGNOSIS — C61 Malignant neoplasm of prostate: Secondary | ICD-10-CM | POA: Diagnosis not present

## 2017-02-04 DIAGNOSIS — R972 Elevated prostate specific antigen [PSA]: Secondary | ICD-10-CM | POA: Diagnosis not present

## 2017-02-06 DIAGNOSIS — M19041 Primary osteoarthritis, right hand: Secondary | ICD-10-CM | POA: Diagnosis not present

## 2017-02-06 DIAGNOSIS — I10 Essential (primary) hypertension: Secondary | ICD-10-CM | POA: Diagnosis not present

## 2017-02-06 DIAGNOSIS — E7849 Other hyperlipidemia: Secondary | ICD-10-CM | POA: Diagnosis not present

## 2017-02-06 DIAGNOSIS — Z6832 Body mass index (BMI) 32.0-32.9, adult: Secondary | ICD-10-CM | POA: Diagnosis not present

## 2017-02-06 DIAGNOSIS — E1129 Type 2 diabetes mellitus with other diabetic kidney complication: Secondary | ICD-10-CM | POA: Diagnosis not present

## 2017-02-06 DIAGNOSIS — N182 Chronic kidney disease, stage 2 (mild): Secondary | ICD-10-CM | POA: Diagnosis not present

## 2017-04-22 DIAGNOSIS — R972 Elevated prostate specific antigen [PSA]: Secondary | ICD-10-CM | POA: Diagnosis not present

## 2017-04-24 DIAGNOSIS — L814 Other melanin hyperpigmentation: Secondary | ICD-10-CM | POA: Diagnosis not present

## 2017-04-24 DIAGNOSIS — D1801 Hemangioma of skin and subcutaneous tissue: Secondary | ICD-10-CM | POA: Diagnosis not present

## 2017-04-24 DIAGNOSIS — L570 Actinic keratosis: Secondary | ICD-10-CM | POA: Diagnosis not present

## 2017-04-24 DIAGNOSIS — C44519 Basal cell carcinoma of skin of other part of trunk: Secondary | ICD-10-CM | POA: Diagnosis not present

## 2017-04-24 DIAGNOSIS — L821 Other seborrheic keratosis: Secondary | ICD-10-CM | POA: Diagnosis not present

## 2017-04-24 DIAGNOSIS — Z85828 Personal history of other malignant neoplasm of skin: Secondary | ICD-10-CM | POA: Diagnosis not present

## 2017-04-24 DIAGNOSIS — D225 Melanocytic nevi of trunk: Secondary | ICD-10-CM | POA: Diagnosis not present

## 2017-04-24 DIAGNOSIS — D2261 Melanocytic nevi of right upper limb, including shoulder: Secondary | ICD-10-CM | POA: Diagnosis not present

## 2017-05-03 DIAGNOSIS — N528 Other male erectile dysfunction: Secondary | ICD-10-CM | POA: Diagnosis not present

## 2017-05-03 DIAGNOSIS — R9721 Rising PSA following treatment for malignant neoplasm of prostate: Secondary | ICD-10-CM | POA: Diagnosis not present

## 2017-07-26 DIAGNOSIS — H52203 Unspecified astigmatism, bilateral: Secondary | ICD-10-CM | POA: Diagnosis not present

## 2017-07-26 DIAGNOSIS — H25813 Combined forms of age-related cataract, bilateral: Secondary | ICD-10-CM | POA: Diagnosis not present

## 2017-07-26 DIAGNOSIS — H401131 Primary open-angle glaucoma, bilateral, mild stage: Secondary | ICD-10-CM | POA: Diagnosis not present

## 2017-07-26 DIAGNOSIS — D3131 Benign neoplasm of right choroid: Secondary | ICD-10-CM | POA: Diagnosis not present

## 2017-08-05 DIAGNOSIS — C61 Malignant neoplasm of prostate: Secondary | ICD-10-CM | POA: Diagnosis not present

## 2017-08-28 DIAGNOSIS — I10 Essential (primary) hypertension: Secondary | ICD-10-CM | POA: Diagnosis not present

## 2017-08-28 DIAGNOSIS — E785 Hyperlipidemia, unspecified: Secondary | ICD-10-CM | POA: Diagnosis not present

## 2017-08-28 DIAGNOSIS — E7849 Other hyperlipidemia: Secondary | ICD-10-CM | POA: Diagnosis not present

## 2017-08-28 DIAGNOSIS — R82998 Other abnormal findings in urine: Secondary | ICD-10-CM | POA: Diagnosis not present

## 2017-08-28 DIAGNOSIS — E1129 Type 2 diabetes mellitus with other diabetic kidney complication: Secondary | ICD-10-CM | POA: Diagnosis not present

## 2017-09-04 DIAGNOSIS — I10 Essential (primary) hypertension: Secondary | ICD-10-CM | POA: Diagnosis not present

## 2017-09-04 DIAGNOSIS — C61 Malignant neoplasm of prostate: Secondary | ICD-10-CM | POA: Diagnosis not present

## 2017-09-04 DIAGNOSIS — N182 Chronic kidney disease, stage 2 (mild): Secondary | ICD-10-CM | POA: Diagnosis not present

## 2017-09-04 DIAGNOSIS — Z6832 Body mass index (BMI) 32.0-32.9, adult: Secondary | ICD-10-CM | POA: Diagnosis not present

## 2017-09-04 DIAGNOSIS — Z0000 Encounter for general adult medical examination without abnormal findings: Secondary | ICD-10-CM | POA: Diagnosis not present

## 2017-09-04 DIAGNOSIS — E668 Other obesity: Secondary | ICD-10-CM | POA: Diagnosis not present

## 2017-09-04 DIAGNOSIS — E1129 Type 2 diabetes mellitus with other diabetic kidney complication: Secondary | ICD-10-CM | POA: Diagnosis not present

## 2017-09-04 DIAGNOSIS — E7849 Other hyperlipidemia: Secondary | ICD-10-CM | POA: Diagnosis not present

## 2017-09-04 DIAGNOSIS — Z1389 Encounter for screening for other disorder: Secondary | ICD-10-CM | POA: Diagnosis not present

## 2017-11-08 DIAGNOSIS — C61 Malignant neoplasm of prostate: Secondary | ICD-10-CM | POA: Diagnosis not present

## 2017-11-08 DIAGNOSIS — R972 Elevated prostate specific antigen [PSA]: Secondary | ICD-10-CM | POA: Diagnosis not present

## 2017-11-08 DIAGNOSIS — N529 Male erectile dysfunction, unspecified: Secondary | ICD-10-CM | POA: Diagnosis not present

## 2017-11-15 DIAGNOSIS — L570 Actinic keratosis: Secondary | ICD-10-CM | POA: Diagnosis not present

## 2017-11-15 DIAGNOSIS — L821 Other seborrheic keratosis: Secondary | ICD-10-CM | POA: Diagnosis not present

## 2017-11-15 DIAGNOSIS — C44229 Squamous cell carcinoma of skin of left ear and external auricular canal: Secondary | ICD-10-CM | POA: Diagnosis not present

## 2017-11-15 DIAGNOSIS — D225 Melanocytic nevi of trunk: Secondary | ICD-10-CM | POA: Diagnosis not present

## 2017-11-15 DIAGNOSIS — C44629 Squamous cell carcinoma of skin of left upper limb, including shoulder: Secondary | ICD-10-CM | POA: Diagnosis not present

## 2017-11-15 DIAGNOSIS — L814 Other melanin hyperpigmentation: Secondary | ICD-10-CM | POA: Diagnosis not present

## 2017-11-15 DIAGNOSIS — Z85828 Personal history of other malignant neoplasm of skin: Secondary | ICD-10-CM | POA: Diagnosis not present

## 2017-12-24 DIAGNOSIS — C44229 Squamous cell carcinoma of skin of left ear and external auricular canal: Secondary | ICD-10-CM | POA: Diagnosis not present

## 2017-12-24 DIAGNOSIS — Z85828 Personal history of other malignant neoplasm of skin: Secondary | ICD-10-CM | POA: Diagnosis not present

## 2017-12-27 DIAGNOSIS — H401131 Primary open-angle glaucoma, bilateral, mild stage: Secondary | ICD-10-CM | POA: Diagnosis not present

## 2017-12-27 DIAGNOSIS — H40243 Residual stage of angle-closure glaucoma, bilateral: Secondary | ICD-10-CM | POA: Diagnosis not present

## 2017-12-27 DIAGNOSIS — H25813 Combined forms of age-related cataract, bilateral: Secondary | ICD-10-CM | POA: Diagnosis not present

## 2018-01-07 DIAGNOSIS — Z4802 Encounter for removal of sutures: Secondary | ICD-10-CM | POA: Diagnosis not present

## 2018-01-27 DIAGNOSIS — R339 Retention of urine, unspecified: Secondary | ICD-10-CM | POA: Diagnosis not present

## 2018-01-27 DIAGNOSIS — C61 Malignant neoplasm of prostate: Secondary | ICD-10-CM | POA: Diagnosis not present

## 2018-01-27 DIAGNOSIS — R82998 Other abnormal findings in urine: Secondary | ICD-10-CM | POA: Diagnosis not present

## 2018-02-24 DIAGNOSIS — R972 Elevated prostate specific antigen [PSA]: Secondary | ICD-10-CM | POA: Diagnosis not present

## 2018-03-05 DIAGNOSIS — E7849 Other hyperlipidemia: Secondary | ICD-10-CM | POA: Diagnosis not present

## 2018-03-05 DIAGNOSIS — M19041 Primary osteoarthritis, right hand: Secondary | ICD-10-CM | POA: Diagnosis not present

## 2018-03-05 DIAGNOSIS — I10 Essential (primary) hypertension: Secondary | ICD-10-CM | POA: Diagnosis not present

## 2018-03-05 DIAGNOSIS — Z6833 Body mass index (BMI) 33.0-33.9, adult: Secondary | ICD-10-CM | POA: Diagnosis not present

## 2018-03-05 DIAGNOSIS — E1129 Type 2 diabetes mellitus with other diabetic kidney complication: Secondary | ICD-10-CM | POA: Diagnosis not present

## 2018-04-11 DIAGNOSIS — N529 Male erectile dysfunction, unspecified: Secondary | ICD-10-CM | POA: Diagnosis not present

## 2018-04-11 DIAGNOSIS — R9721 Rising PSA following treatment for malignant neoplasm of prostate: Secondary | ICD-10-CM | POA: Diagnosis not present

## 2018-08-14 DIAGNOSIS — H401131 Primary open-angle glaucoma, bilateral, mild stage: Secondary | ICD-10-CM | POA: Diagnosis not present

## 2018-08-14 DIAGNOSIS — H04123 Dry eye syndrome of bilateral lacrimal glands: Secondary | ICD-10-CM | POA: Diagnosis not present

## 2018-08-14 DIAGNOSIS — H25813 Combined forms of age-related cataract, bilateral: Secondary | ICD-10-CM | POA: Diagnosis not present

## 2018-09-25 DIAGNOSIS — E1129 Type 2 diabetes mellitus with other diabetic kidney complication: Secondary | ICD-10-CM | POA: Diagnosis not present

## 2018-09-25 DIAGNOSIS — E7849 Other hyperlipidemia: Secondary | ICD-10-CM | POA: Diagnosis not present

## 2018-09-26 DIAGNOSIS — I10 Essential (primary) hypertension: Secondary | ICD-10-CM | POA: Diagnosis not present

## 2018-09-26 DIAGNOSIS — R82998 Other abnormal findings in urine: Secondary | ICD-10-CM | POA: Diagnosis not present

## 2018-10-02 DIAGNOSIS — Z1339 Encounter for screening examination for other mental health and behavioral disorders: Secondary | ICD-10-CM | POA: Diagnosis not present

## 2018-10-02 DIAGNOSIS — M19041 Primary osteoarthritis, right hand: Secondary | ICD-10-CM | POA: Diagnosis not present

## 2018-10-02 DIAGNOSIS — N183 Chronic kidney disease, stage 3 (moderate): Secondary | ICD-10-CM | POA: Diagnosis not present

## 2018-10-02 DIAGNOSIS — Z0000 Encounter for general adult medical examination without abnormal findings: Secondary | ICD-10-CM | POA: Diagnosis not present

## 2018-10-02 DIAGNOSIS — C61 Malignant neoplasm of prostate: Secondary | ICD-10-CM | POA: Diagnosis not present

## 2018-10-02 DIAGNOSIS — E1129 Type 2 diabetes mellitus with other diabetic kidney complication: Secondary | ICD-10-CM | POA: Diagnosis not present

## 2018-10-02 DIAGNOSIS — E669 Obesity, unspecified: Secondary | ICD-10-CM | POA: Diagnosis not present

## 2018-10-02 DIAGNOSIS — I10 Essential (primary) hypertension: Secondary | ICD-10-CM | POA: Diagnosis not present

## 2018-10-02 DIAGNOSIS — Z1331 Encounter for screening for depression: Secondary | ICD-10-CM | POA: Diagnosis not present

## 2018-10-02 DIAGNOSIS — E785 Hyperlipidemia, unspecified: Secondary | ICD-10-CM | POA: Diagnosis not present

## 2018-10-13 DIAGNOSIS — R972 Elevated prostate specific antigen [PSA]: Secondary | ICD-10-CM | POA: Diagnosis not present

## 2018-10-13 DIAGNOSIS — C61 Malignant neoplasm of prostate: Secondary | ICD-10-CM | POA: Diagnosis not present

## 2018-10-13 DIAGNOSIS — N528 Other male erectile dysfunction: Secondary | ICD-10-CM | POA: Diagnosis not present

## 2018-11-18 DIAGNOSIS — N200 Calculus of kidney: Secondary | ICD-10-CM | POA: Diagnosis not present

## 2018-11-18 DIAGNOSIS — R972 Elevated prostate specific antigen [PSA]: Secondary | ICD-10-CM | POA: Diagnosis not present

## 2018-11-18 DIAGNOSIS — C772 Secondary and unspecified malignant neoplasm of intra-abdominal lymph nodes: Secondary | ICD-10-CM | POA: Diagnosis not present

## 2018-11-18 DIAGNOSIS — R918 Other nonspecific abnormal finding of lung field: Secondary | ICD-10-CM | POA: Diagnosis not present

## 2018-11-18 DIAGNOSIS — C7951 Secondary malignant neoplasm of bone: Secondary | ICD-10-CM | POA: Diagnosis not present

## 2018-11-18 DIAGNOSIS — C61 Malignant neoplasm of prostate: Secondary | ICD-10-CM | POA: Diagnosis not present

## 2018-11-19 DIAGNOSIS — Z20828 Contact with and (suspected) exposure to other viral communicable diseases: Secondary | ICD-10-CM | POA: Diagnosis not present

## 2018-11-19 DIAGNOSIS — N201 Calculus of ureter: Secondary | ICD-10-CM | POA: Diagnosis not present

## 2018-11-19 DIAGNOSIS — Z01812 Encounter for preprocedural laboratory examination: Secondary | ICD-10-CM | POA: Diagnosis not present

## 2018-11-20 HISTORY — PX: CYSTOSCOPY: SUR368

## 2018-11-25 DIAGNOSIS — N3289 Other specified disorders of bladder: Secondary | ICD-10-CM | POA: Diagnosis not present

## 2018-11-25 DIAGNOSIS — I498 Other specified cardiac arrhythmias: Secondary | ICD-10-CM | POA: Diagnosis not present

## 2018-11-25 DIAGNOSIS — N361 Urethral diverticulum: Secondary | ICD-10-CM | POA: Diagnosis not present

## 2018-11-25 DIAGNOSIS — I444 Left anterior fascicular block: Secondary | ICD-10-CM | POA: Diagnosis not present

## 2018-11-25 DIAGNOSIS — Z87891 Personal history of nicotine dependence: Secondary | ICD-10-CM | POA: Diagnosis not present

## 2018-11-25 DIAGNOSIS — N202 Calculus of kidney with calculus of ureter: Secondary | ICD-10-CM | POA: Diagnosis not present

## 2018-11-25 DIAGNOSIS — Z8546 Personal history of malignant neoplasm of prostate: Secondary | ICD-10-CM | POA: Diagnosis not present

## 2018-12-02 DIAGNOSIS — R339 Retention of urine, unspecified: Secondary | ICD-10-CM | POA: Diagnosis not present

## 2018-12-10 DIAGNOSIS — R399 Unspecified symptoms and signs involving the genitourinary system: Secondary | ICD-10-CM | POA: Diagnosis not present

## 2018-12-16 DIAGNOSIS — N201 Calculus of ureter: Secondary | ICD-10-CM | POA: Diagnosis not present

## 2018-12-16 DIAGNOSIS — Z20828 Contact with and (suspected) exposure to other viral communicable diseases: Secondary | ICD-10-CM | POA: Diagnosis not present

## 2018-12-16 DIAGNOSIS — Z01812 Encounter for preprocedural laboratory examination: Secondary | ICD-10-CM | POA: Diagnosis not present

## 2018-12-21 HISTORY — PX: CYSTOSCOPY: SUR368

## 2018-12-23 DIAGNOSIS — I10 Essential (primary) hypertension: Secondary | ICD-10-CM | POA: Diagnosis not present

## 2018-12-23 DIAGNOSIS — N132 Hydronephrosis with renal and ureteral calculous obstruction: Secondary | ICD-10-CM | POA: Diagnosis not present

## 2018-12-23 DIAGNOSIS — N201 Calculus of ureter: Secondary | ICD-10-CM | POA: Diagnosis not present

## 2018-12-23 DIAGNOSIS — E785 Hyperlipidemia, unspecified: Secondary | ICD-10-CM | POA: Diagnosis not present

## 2018-12-23 DIAGNOSIS — Z87891 Personal history of nicotine dependence: Secondary | ICD-10-CM | POA: Diagnosis not present

## 2018-12-23 DIAGNOSIS — N200 Calculus of kidney: Secondary | ICD-10-CM | POA: Diagnosis not present

## 2018-12-23 DIAGNOSIS — Z8546 Personal history of malignant neoplasm of prostate: Secondary | ICD-10-CM | POA: Diagnosis not present

## 2019-01-07 DIAGNOSIS — R310 Gross hematuria: Secondary | ICD-10-CM | POA: Diagnosis not present

## 2019-01-07 DIAGNOSIS — N3289 Other specified disorders of bladder: Secondary | ICD-10-CM | POA: Diagnosis not present

## 2019-01-07 DIAGNOSIS — R399 Unspecified symptoms and signs involving the genitourinary system: Secondary | ICD-10-CM | POA: Diagnosis not present

## 2019-01-07 DIAGNOSIS — N201 Calculus of ureter: Secondary | ICD-10-CM | POA: Diagnosis not present

## 2019-01-07 DIAGNOSIS — R3989 Other symptoms and signs involving the genitourinary system: Secondary | ICD-10-CM | POA: Diagnosis not present

## 2019-01-07 DIAGNOSIS — R82998 Other abnormal findings in urine: Secondary | ICD-10-CM | POA: Diagnosis not present

## 2019-01-07 DIAGNOSIS — N1330 Unspecified hydronephrosis: Secondary | ICD-10-CM | POA: Diagnosis not present

## 2019-01-16 DIAGNOSIS — Z01812 Encounter for preprocedural laboratory examination: Secondary | ICD-10-CM | POA: Diagnosis not present

## 2019-01-16 DIAGNOSIS — N201 Calculus of ureter: Secondary | ICD-10-CM | POA: Diagnosis not present

## 2019-01-16 DIAGNOSIS — Z20828 Contact with and (suspected) exposure to other viral communicable diseases: Secondary | ICD-10-CM | POA: Diagnosis not present

## 2019-01-20 DIAGNOSIS — N179 Acute kidney failure, unspecified: Secondary | ICD-10-CM

## 2019-01-20 HISTORY — DX: Acute kidney failure, unspecified: N17.9

## 2019-01-20 HISTORY — PX: OTHER SURGICAL HISTORY: SHX169

## 2019-01-22 DIAGNOSIS — H25813 Combined forms of age-related cataract, bilateral: Secondary | ICD-10-CM | POA: Diagnosis not present

## 2019-01-22 DIAGNOSIS — D3131 Benign neoplasm of right choroid: Secondary | ICD-10-CM | POA: Diagnosis not present

## 2019-01-22 DIAGNOSIS — H5203 Hypermetropia, bilateral: Secondary | ICD-10-CM | POA: Diagnosis not present

## 2019-01-22 DIAGNOSIS — H401131 Primary open-angle glaucoma, bilateral, mild stage: Secondary | ICD-10-CM | POA: Diagnosis not present

## 2019-01-23 DIAGNOSIS — N201 Calculus of ureter: Secondary | ICD-10-CM | POA: Diagnosis not present

## 2019-01-23 DIAGNOSIS — Z87891 Personal history of nicotine dependence: Secondary | ICD-10-CM | POA: Diagnosis not present

## 2019-01-23 DIAGNOSIS — N202 Calculus of kidney with calculus of ureter: Secondary | ICD-10-CM | POA: Diagnosis not present

## 2019-01-23 DIAGNOSIS — N131 Hydronephrosis with ureteral stricture, not elsewhere classified: Secondary | ICD-10-CM | POA: Diagnosis not present

## 2019-01-23 DIAGNOSIS — Z923 Personal history of irradiation: Secondary | ICD-10-CM | POA: Diagnosis not present

## 2019-01-23 DIAGNOSIS — Z466 Encounter for fitting and adjustment of urinary device: Secondary | ICD-10-CM | POA: Diagnosis not present

## 2019-01-23 HISTORY — PX: OTHER SURGICAL HISTORY: SHX169

## 2019-01-24 DIAGNOSIS — Z87891 Personal history of nicotine dependence: Secondary | ICD-10-CM | POA: Diagnosis not present

## 2019-01-24 DIAGNOSIS — Z923 Personal history of irradiation: Secondary | ICD-10-CM | POA: Diagnosis not present

## 2019-01-24 DIAGNOSIS — N202 Calculus of kidney with calculus of ureter: Secondary | ICD-10-CM | POA: Diagnosis not present

## 2019-01-24 DIAGNOSIS — N131 Hydronephrosis with ureteral stricture, not elsewhere classified: Secondary | ICD-10-CM | POA: Diagnosis not present

## 2019-01-26 DIAGNOSIS — R339 Retention of urine, unspecified: Secondary | ICD-10-CM | POA: Diagnosis not present

## 2019-01-26 DIAGNOSIS — Z466 Encounter for fitting and adjustment of urinary device: Secondary | ICD-10-CM | POA: Diagnosis not present

## 2019-02-18 ENCOUNTER — Encounter (HOSPITAL_COMMUNITY): Payer: Self-pay | Admitting: Emergency Medicine

## 2019-02-18 ENCOUNTER — Other Ambulatory Visit: Payer: Self-pay

## 2019-02-18 ENCOUNTER — Emergency Department (HOSPITAL_COMMUNITY): Payer: Medicare Other

## 2019-02-18 ENCOUNTER — Inpatient Hospital Stay (HOSPITAL_COMMUNITY)
Admission: EM | Admit: 2019-02-18 | Discharge: 2019-02-22 | DRG: 690 | Disposition: A | Discharge status: Home / Self Care | Payer: Medicare Other | Source: Ambulatory Visit | Attending: Urology | Admitting: Urology

## 2019-02-18 DIAGNOSIS — I10 Essential (primary) hypertension: Secondary | ICD-10-CM | POA: Diagnosis present

## 2019-02-18 DIAGNOSIS — B952 Enterococcus as the cause of diseases classified elsewhere: Secondary | ICD-10-CM | POA: Diagnosis present

## 2019-02-18 DIAGNOSIS — Z87891 Personal history of nicotine dependence: Secondary | ICD-10-CM

## 2019-02-18 DIAGNOSIS — C7951 Secondary malignant neoplasm of bone: Secondary | ICD-10-CM | POA: Diagnosis present

## 2019-02-18 DIAGNOSIS — E785 Hyperlipidemia, unspecified: Secondary | ICD-10-CM | POA: Diagnosis present

## 2019-02-18 DIAGNOSIS — N200 Calculus of kidney: Secondary | ICD-10-CM | POA: Diagnosis not present

## 2019-02-18 DIAGNOSIS — Z20828 Contact with and (suspected) exposure to other viral communicable diseases: Secondary | ICD-10-CM | POA: Diagnosis present

## 2019-02-18 DIAGNOSIS — N179 Acute kidney failure, unspecified: Secondary | ICD-10-CM | POA: Diagnosis present

## 2019-02-18 DIAGNOSIS — R918 Other nonspecific abnormal finding of lung field: Secondary | ICD-10-CM | POA: Diagnosis present

## 2019-02-18 DIAGNOSIS — Z923 Personal history of irradiation: Secondary | ICD-10-CM

## 2019-02-18 DIAGNOSIS — N1330 Unspecified hydronephrosis: Secondary | ICD-10-CM | POA: Diagnosis not present

## 2019-02-18 DIAGNOSIS — C61 Malignant neoplasm of prostate: Secondary | ICD-10-CM | POA: Diagnosis present

## 2019-02-18 DIAGNOSIS — N132 Hydronephrosis with renal and ureteral calculous obstruction: Secondary | ICD-10-CM | POA: Diagnosis present

## 2019-02-18 DIAGNOSIS — N368 Other specified disorders of urethra: Secondary | ICD-10-CM | POA: Diagnosis present

## 2019-02-18 DIAGNOSIS — Z7982 Long term (current) use of aspirin: Secondary | ICD-10-CM

## 2019-02-18 DIAGNOSIS — Z960 Presence of urogenital implants: Secondary | ICD-10-CM | POA: Diagnosis not present

## 2019-02-18 DIAGNOSIS — N151 Renal and perinephric abscess: Secondary | ICD-10-CM | POA: Diagnosis not present

## 2019-02-18 DIAGNOSIS — R509 Fever, unspecified: Secondary | ICD-10-CM | POA: Diagnosis not present

## 2019-02-18 DIAGNOSIS — D649 Anemia, unspecified: Secondary | ICD-10-CM | POA: Diagnosis present

## 2019-02-18 LAB — COMPREHENSIVE METABOLIC PANEL
ALT: 53 U/L — ABNORMAL HIGH (ref 0–44)
AST: 25 U/L (ref 15–41)
Albumin: 2.7 g/dL — ABNORMAL LOW (ref 3.5–5.0)
Alkaline Phosphatase: 113 U/L (ref 38–126)
Anion gap: 12 (ref 5–15)
BUN: 20 mg/dL (ref 8–23)
CO2: 22 mmol/L (ref 22–32)
Calcium: 8.8 mg/dL — ABNORMAL LOW (ref 8.9–10.3)
Chloride: 98 mmol/L (ref 98–111)
Creatinine, Ser: 1.73 mg/dL — ABNORMAL HIGH (ref 0.61–1.24)
GFR calc Af Amer: 41 mL/min — ABNORMAL LOW (ref >60)
GFR calc non Af Amer: 35 mL/min — ABNORMAL LOW (ref >60)
Glucose, Bld: 198 mg/dL — ABNORMAL HIGH (ref 70–99)
Potassium: 4.6 mmol/L (ref 3.5–5.1)
Sodium: 132 mmol/L — ABNORMAL LOW (ref 135–145)
Total Bilirubin: 0.6 mg/dL (ref 0.3–1.2)
Total Protein: 6.5 g/dL (ref 6.5–8.1)

## 2019-02-18 LAB — CBC WITH DIFFERENTIAL/PLATELET
Abs Immature Granulocytes: 0.28 10*3/uL — ABNORMAL HIGH (ref 0.00–0.07)
Basophils Absolute: 0 10*3/uL (ref 0.0–0.1)
Basophils Relative: 0 %
Eosinophils Absolute: 0 10*3/uL (ref 0.0–0.5)
Eosinophils Relative: 0 %
HCT: 30.2 % — ABNORMAL LOW (ref 39.0–52.0)
Hemoglobin: 9.7 g/dL — ABNORMAL LOW (ref 13.0–17.0)
Immature Granulocytes: 1 %
Lymphocytes Relative: 3 %
Lymphs Abs: 0.8 10*3/uL (ref 0.7–4.0)
MCH: 32 pg (ref 26.0–34.0)
MCHC: 32.1 g/dL (ref 30.0–36.0)
MCV: 99.7 fL (ref 80.0–100.0)
Monocytes Absolute: 2 10*3/uL — ABNORMAL HIGH (ref 0.1–1.0)
Monocytes Relative: 8 %
Neutro Abs: 20.8 10*3/uL — ABNORMAL HIGH (ref 1.7–7.7)
Neutrophils Relative %: 88 %
Platelets: 428 10*3/uL — ABNORMAL HIGH (ref 150–400)
RBC: 3.03 MIL/uL — ABNORMAL LOW (ref 4.22–5.81)
RDW: 12.6 % (ref 11.5–15.5)
WBC: 24 10*3/uL — ABNORMAL HIGH (ref 4.0–10.5)
nRBC: 0 % (ref 0.0–0.2)

## 2019-02-18 LAB — URINALYSIS, ROUTINE W REFLEX MICROSCOPIC
Bilirubin Urine: NEGATIVE
Glucose, UA: 50 mg/dL — AB
Ketones, ur: NEGATIVE mg/dL
Nitrite: NEGATIVE
Protein, ur: 30 mg/dL — AB
Specific Gravity, Urine: 1.013 (ref 1.005–1.030)
WBC, UA: >50 WBC/hpf — ABNORMAL HIGH (ref 0–5)
pH: 5 (ref 5.0–8.0)

## 2019-02-18 LAB — LIPASE, BLOOD: Lipase: 27 U/L (ref 11–51)

## 2019-02-18 LAB — LACTIC ACID, PLASMA: Lactic Acid, Venous: 1.1 mmol/L (ref 0.5–1.9)

## 2019-02-18 MED ORDER — SODIUM CHLORIDE 0.9 % IV SOLN
1.0000 g | INTRAVENOUS | Status: DC
  Administered 2019-02-19 – 2019-02-20 (×2): 1 g via INTRAVENOUS
  Filled 2019-02-18: qty 10
  Filled 2019-02-18 (×2): qty 1

## 2019-02-18 MED ORDER — IOHEXOL 300 MG/ML  SOLN
80.0000 mL | Freq: Once | INTRAMUSCULAR | Status: AC | PRN
  Administered 2019-02-18: 100 mL via INTRAVENOUS

## 2019-02-18 MED ORDER — SODIUM CHLORIDE 0.9 % IV SOLN: INTRAVENOUS | Status: DC

## 2019-02-18 MED ORDER — RAMIPRIL 10 MG PO CAPS
10.0000 mg | ORAL_CAPSULE | Freq: Every day | ORAL | Status: DC
  Administered 2019-02-19 – 2019-02-22 (×4): 10 mg via ORAL
  Filled 2019-02-18 (×4): qty 1

## 2019-02-18 MED ORDER — SODIUM CHLORIDE 0.9 % IV BOLUS
1000.0000 mL | Freq: Once | INTRAVENOUS | Status: AC
  Administered 2019-02-18: 1000 mL via INTRAVENOUS

## 2019-02-18 MED ORDER — SIMVASTATIN 40 MG PO TABS
40.0000 mg | ORAL_TABLET | Freq: Every day | ORAL | Status: DC
  Administered 2019-02-18 – 2019-02-21 (×4): 40 mg via ORAL
  Filled 2019-02-18 (×3): qty 1
  Filled 2019-02-18 (×2): qty 2
  Filled 2019-02-18 (×2): qty 1

## 2019-02-18 MED ORDER — SODIUM CHLORIDE 0.9 % IV SOLN
1.0000 g | Freq: Once | INTRAVENOUS | Status: AC
  Administered 2019-02-18: 1 g via INTRAVENOUS
  Filled 2019-02-18: qty 10

## 2019-02-18 MED ORDER — ACETAMINOPHEN 325 MG PO TABS
650.0000 mg | ORAL_TABLET | Freq: Four times a day (QID) | ORAL | Status: DC | PRN
  Administered 2019-02-19: 650 mg via ORAL
  Filled 2019-02-18: qty 2

## 2019-02-18 MED ORDER — SODIUM CHLORIDE (PF) 0.9 % IJ SOLN
INTRAMUSCULAR | Status: AC
  Filled 2019-02-18: qty 50

## 2019-02-18 MED ORDER — IOHEXOL 300 MG/ML  SOLN: 100.0000 mL | Freq: Once | INTRAMUSCULAR | Status: DC | PRN

## 2019-02-18 MED ORDER — LATANOPROST 0.005 % OP SOLN
1.0000 [drp] | Freq: Every day | OPHTHALMIC | Status: DC
  Administered 2019-02-19 – 2019-02-21 (×4): 1 [drp] via OPHTHALMIC
  Filled 2019-02-18: qty 2.5

## 2019-02-18 MED ORDER — TIMOLOL MALEATE 0.5 % OP SOLN
1.0000 [drp] | Freq: Two times a day (BID) | OPHTHALMIC | Status: DC
  Administered 2019-02-19 – 2019-02-22 (×8): 1 [drp] via OPHTHALMIC
  Filled 2019-02-18: qty 5

## 2019-02-18 NOTE — ED Notes (Signed)
Wife Contact for Updates: [phone] (home) [phone] ("car phone")

## 2019-02-18 NOTE — ED Notes (Signed)
Pt on the phone with his wife

## 2019-02-18 NOTE — ED Notes (Signed)
Admitting MD at bedside.

## 2019-02-18 NOTE — ED Provider Notes (Signed)
[facility] DEPT Provider Note   CSN: [id number] Arrival date & time: 02/18/19  1517     History Chief Complaint  Patient presents with  . Abnormal Lab    Levi Cervantes is a 85 y.o. male.  HPI     Stent placed 12/4 Dr. Danise Mina, left side Dr. Brigitte Pulse concerned could be an infection related to this Having low grade fevers  Temps 99, feeling warm/felt febrile felt like had a chill Reports dysuria, mild No nausea/vomiting No diarrhea/constipation Sore over flank 2 weeks ago to feel like had a fever, began cipro from Dr. Brigitte Pulse and finished that days ago Felt like fever came back last night  Labs done at Dr. Raul Del showed WBC 28000   Past Medical History:  Diagnosis Date  . Arthritis   . Glaucoma    High interocular pressure  . Hyperlipidemia   . Hypertension   . Prostate cancer [facility]) 2011   Radiation tx    Patient Active Problem List   Diagnosis Date Noted  . Perirenal abscess 02/18/2019    Past Surgical History:  Procedure Laterality Date  . CARPAL TUNNEL RELEASE Right 2011  . COLONOSCOPY W/ BIOPSIES AND POLYPECTOMY  2012  . TONSILLECTOMY  1945       Family History  Problem Relation Age of Onset  . Colon cancer Neg Hx     Social History   Tobacco Use  . Smoking status: Former Smoker    Quit date: 02/19/1962    Years since quitting: 57.0  . Smokeless tobacco: Never Used  Substance Use Topics  . Alcohol use: Yes    Alcohol/week: 10.0 standard drinks    Types: 10 drink(s) per week  . Drug use: No    Home Medications Prior to Admission medications   Medication Sig Start Date End Date Taking? Authorizing Provider  aspirin 81 MG tablet Take 81 mg by mouth daily.     Yes [provider]  latanoprost (XALATAN) 0.005 % ophthalmic solution Place 1 drop into both eyes at bedtime.   Yes [provider]  Multiple Vitamins-Minerals (MULTIVITAMIN WITH MINERALS) tablet Take 1 tablet by mouth daily.     Yes  [provider]  ramipril (ALTACE) 10 MG capsule Take 10 mg by mouth daily. 01/01/19  Yes [provider]  simvastatin (ZOCOR) 40 MG tablet Take 40 mg by mouth at bedtime.     Yes [provider]  timolol (TIMOPTIC) 0.5 % ophthalmic solution Place 1 drop into both eyes 2 (two) times daily. Each eye. 05/10/10  Yes [provider]  LUMIGAN 0.01 % SOLN Place 1 drop into the right eye at bedtime. 01/22/19   [provider]    Allergies    Patient has no known allergies.  Review of Systems   Review of Systems  Constitutional: Positive for chills, fatigue and fever (reports feeling like fever, tmax 99). Negative for appetite change.  HENT: Negative for sore throat.   Eyes: Negative for visual disturbance.  Respiratory: Negative for cough and shortness of breath.   Cardiovascular: Negative for chest pain.  Gastrointestinal: Negative for abdominal pain, constipation, diarrhea, nausea and vomiting.  Genitourinary: Positive for flank pain. Negative for difficulty urinating and dysuria (reports minimal only when bladder really full).  Musculoskeletal: Negative for back pain and neck stiffness.  Skin: Negative for rash.  Neurological: Negative for syncope and headaches.    Physical Exam Updated Vital Signs BP (!) 165/80 (BP Location: Left Arm)  Pulse 97   Temp 98.2 F (36.8 C) (Oral)   Resp 18   Ht 5' 10.5" (1.791 m)   Wt 90.7 kg   SpO2 98%   BMI 28.29 kg/m   Physical Exam Vitals and nursing note reviewed.  Constitutional:      General: He is not in acute distress.    Appearance: He is well-developed. He is not diaphoretic.  HENT:     Head: Normocephalic and atraumatic.  Eyes:     Conjunctiva/sclera: Conjunctivae normal.  Cardiovascular:     Rate and Rhythm: Normal rate and regular rhythm.  Pulmonary:     Effort: Pulmonary effort is normal. No respiratory distress.  Abdominal:     General: There is no distension.     Palpations:  Abdomen is soft.     Tenderness: There is abdominal tenderness (left flank). There is no guarding.  Musculoskeletal:     Cervical back: Normal range of motion.  Skin:    General: Skin is warm and dry.  Neurological:     Mental Status: He is alert and oriented to person, place, and time.     ED Results / Procedures / Treatments   Labs (all labs ordered are listed, but only abnormal results are displayed) Labs Reviewed  CBC WITH DIFFERENTIAL/PLATELET - Abnormal; Notable for the following components:      Result Value   WBC 24.0 (*)    RBC 3.03 (*)    Hemoglobin 9.7 (*)    HCT 30.2 (*)    Platelets 428 (*)    Neutro Abs 20.8 (*)    Monocytes Absolute 2.0 (*)    Abs Immature Granulocytes 0.28 (*)    All other components within normal limits  COMPREHENSIVE METABOLIC PANEL - Abnormal; Notable for the following components:   Sodium 132 (*)    Glucose, Bld 198 (*)    Creatinine, Ser 1.73 (*)    Calcium 8.8 (*)    Albumin 2.7 (*)    ALT 53 (*)    GFR calc non Af Amer 35 (*)    GFR calc Af Amer 41 (*)    All other components within normal limits  URINALYSIS, ROUTINE W REFLEX MICROSCOPIC - Abnormal; Notable for the following components:   APPearance CLOUDY (*)    Glucose, UA 50 (*)    Hgb urine dipstick SMALL (*)    Protein, ur 30 (*)    Leukocytes,Ua LARGE (*)    WBC, UA >50 (*)    Bacteria, UA MANY (*)    All other components within normal limits  CULTURE, BLOOD (ROUTINE X 2)  CULTURE, BLOOD (ROUTINE X 2)  URINE CULTURE  SARS CORONAVIRUS 2 (TAT 6-24 HRS)  LACTIC ACID, PLASMA  LIPASE, BLOOD  LACTIC ACID, PLASMA  BASIC METABOLIC PANEL  CBC    EKG None  Radiology CT ABDOMEN PELVIS W CONTRAST  Result Date: 02/18/2019 CLINICAL DATA:  Pain status post ureteral stent placement. EXAM: CT ABDOMEN AND PELVIS WITH CONTRAST TECHNIQUE: Multidetector CT imaging of the abdomen and pelvis was performed using the standard protocol following bolus administration of intravenous  contrast. CONTRAST:  165mL OMNIPAQUE IOHEXOL 300 MG/ML  SOLN COMPARISON:  April 27, 2008. FINDINGS: Lower chest: There is a 5 mm pulmonary nodule in the right lower lobe (axial series 4, image 19). There is pleuroparenchymal scarring at the lung bases. There is a 5 mm pulmonary nodule in the left lower lobe (axial series 4, image 13).The heart size is normal. Hepatobiliary: The liver  is normal. Normal gallbladder.There is no biliary ductal dilation. Pancreas: Normal contours without ductal dilatation. No peripancreatic fluid collection. Spleen: No splenic laceration or hematoma. Adrenals/Urinary Tract: --Adrenal glands: No adrenal hemorrhage. --Right kidney/ureter: There is mild right-sided hydroureteronephrosis to the level of the high pelvis. There is no evidence for an obstructing lesion. --Left kidney/ureter: There is moderate left-sided hydronephrosis. A ureteral stent is noted and position within the renal pelvis proximally and the urinary bladder distally. There is a nonobstructing stone in the upper pole the left kidney. There is a tract coursing from the posterior interpolar region of the kidney to the patient's back. In the left posterior pararenal space there is a rim enhancing fluid collection measuring approximately 4.8 by 6 cm. There is a smaller component within the left perinephric space. This collection tracks to the posterolateral abdominal wall. There is adjacent fat stranding. --Urinary bladder: There is diffuse bladder wall thickening with evidence for trabeculation and diverticula. The bladder is moderately distended. Stomach/Bowel: --Stomach/Duodenum: No hiatal hernia or other gastric abnormality. Normal duodenal course and caliber. --Small bowel: No dilatation or inflammation. --Colon: No focal abnormality. --Appendix: Normal. Vascular/Lymphatic: Atherosclerotic calcification is present within the non-aneurysmal abdominal aorta, without hemodynamically significant stenosis. --No  retroperitoneal lymphadenopathy. --No mesenteric lymphadenopathy. --there is a 1 cm lymph node along the course of the right common iliac vasculature. Reproductive: The prostate gland is enlarged. Other: There is some reactive free fluid in the left retroperitoneal space. There are bilateral fat containing inguinal hernias, left greater than right. Musculoskeletal. There is a large sclerotic lesion measuring approximately 2.5 cm located within the L2 vertebral body. This was not present on a CT from 2010. There is a cystic lesion involving the L4 vertebral body favored to represent a prominent Schmorl's node. Degenerative changes are noted throughout the visualized lumbar spine with grade 1 anterolisthesis of L4 on L5. IMPRESSION: 1. Relatively well positioned left-sided double-J ureteral stent. There is persistent moderate left-sided hydroureteronephrosis raising concern for stent malfunction. 2. Complex rim enhancing fluid collection in the left posterior perirenal and posterior pararenal spaces. Findings may represent a urinoma status post left-sided nephrostomy tube removal versus a developing abscess in the appropriate clinical setting. 3. Mild right-sided hydronephrosis. 4. Distended urinary bladder with evidence for chronic outlet obstruction. 5. Sclerotic lesion in the L2 vertebral body favored to represent the known metastatic lesion at this level as reported on prior outside imaging. 6. Small 5 mm bilateral pulmonary nodules. Given the patient's history of metastatic prostate cancer, a repeat CT scan should be considered in approximately 3-6 months. 7. Additional chronic findings as detailed above. Electronically Signed   By: Constance Holster M.D.   On: 02/18/2019 18:29    Procedures .Critical Care Performed by: Gareth Morgan, MD Authorized by: Gareth Morgan, MD   Critical care provider statement:    Critical care time (minutes):  45   Critical care was time spent personally by me on the  following activities:  Discussions with consultants, evaluation of patient's response to treatment, examination of patient, ordering and performing treatments and interventions, ordering and review of laboratory studies, ordering and review of radiographic studies, pulse oximetry, re-evaluation of patient's condition, obtaining history from patient or surrogate and review of old charts   (including critical care time)  Medications Ordered in ED Medications  sodium chloride (PF) 0.9 % injection (has no administration in time range)  0.9 %  sodium chloride infusion ( Intravenous New Bag/Given 02/18/19 2329)  cefTRIAXone (ROCEPHIN) 1 g in sodium chloride  0.9 % 100 mL IVPB (has no administration in time range)  acetaminophen (TYLENOL) tablet 650 mg (has no administration in time range)  latanoprost (XALATAN) 0.005 % ophthalmic solution 1 drop (has no administration in time range)  ramipril (ALTACE) capsule 10 mg (has no administration in time range)  simvastatin (ZOCOR) tablet 40 mg (40 mg Oral Given 02/18/19 2306)  timolol (TIMOPTIC) 0.5 % ophthalmic solution 1 drop (has no administration in time range)  sodium chloride 0.9 % bolus 1,000 mL (0 mLs Intravenous Stopped 02/18/19 1858)  cefTRIAXone (ROCEPHIN) 1 g in sodium chloride 0.9 % 100 mL IVPB (0 g Intravenous Stopped 02/18/19 1809)  iohexol (OMNIPAQUE) 300 MG/ML solution 80 mL (100 mLs Intravenous Contrast Given 02/18/19 1746)    ED Course  I have reviewed the triage vital signs and the nursing notes.  Pertinent labs & imaging results that were available during my care of the patient were reviewed by me and considered in my medical decision making (see chart for details).    MDM Rules/Calculators/A&P                       85 year old male with a history of hypertension, hyperlipidemia presents with concern for flank pain, subjective fevers at home, and leukocytosis seen on outpatient labs from his PCPs office in the setting of  percutaneous removal of kidney stone with Dr. Danise Mina.   Urinalysis looks consistent with urinary tract infection.  Lactic acid within normal limits.  Patient white blood count of 24,000. Cr 1.73.   CT abdomen pelvis shows left-sided double-J ureteral stent with moderate left-sided hydroureteronephrosis, as well as fluid collection in the left perirenal and posterior pararenal spaces.  Discussed with patient, who reports he does not want me to call his [facility] urologist, does not want to have transfer to [facility], and would like to see [facility] from this point forward.  Reports that he had already called Dr. Noland Fordyce office to set up an appointment and wanted to discontinue his care with [facility] urology.  Given this, consulted [facility], and Dr. Anthony Sar came to bedside. Dr. [facility] to admit for further care.     Final Clinical Impression(s) / ED Diagnoses Final diagnoses:  Perirenal abscess    Rx / DC Orders ED Discharge Orders    None       Gareth Morgan, MD 02/19/19 0028

## 2019-02-18 NOTE — ED Notes (Signed)
Pt ambulatory with steady gait to bathroom

## 2019-02-18 NOTE — ED Triage Notes (Signed)
Patient sent from PCP for evaluation of elevated WBC and possible stent infection. Prescribed Cipro x2 weeks ago without relief. PCP reports rapid Covid test was negative.

## 2019-02-18 NOTE — H&P (Signed)
Urology History and Physical  Reason for Admission:  Infection after PCNL  HPI: Levi Cervantes is seen in consultation for reasons noted above at the request of Gareth Morgan, MD   This is a 85 y.o. male with past medical history of hypertension, hyperlipidemia, prostate cancer status post radiation in 2011, nephrolithiasis status post PCNL at [facility] on 01/23/2019. Was in the hospital for 1 day post op and had neph tube removed prior to discharge. Foley removed 3 days post op. Told he had a non obstructing intraparenchymal stone left over.   Saw his primary care physician today with subjective chills, fever.  Labs performed which showed white count of 28,000.  He was sent to the emergency room.  In the [place] long emergency room he was afebrile and hemodynamically stable.  CT scan showed appropriately positioned left JJ stent with residual hydronephrosis and a upper pole stone.  There was also a 6 cm rim-enhancing fluid correction in the posterior pararenal space. He was given Ceftriaxone   Current symptoms include chills, malaise. Minimal left flank pain. No drainage from PCN incision. Has some urgency given stent placement. No dysuria/hematura  He is very unhappy with his care at [facility] and is refusing transfer back to them for urologic care in this postoperative period   Past Medical History: Past Medical History:  Diagnosis Date  . Arthritis   . Glaucoma    High interocular pressure  . Hyperlipidemia   . Hypertension   . Prostate cancer [facility]) 2011   Radiation tx    Past Surgical History:  Past Surgical History:  Procedure Laterality Date  . CARPAL TUNNEL RELEASE Right 2011  . COLONOSCOPY W/ BIOPSIES AND POLYPECTOMY  2012  . TONSILLECTOMY  1945    Medication: Current Facility-Administered Medications  Medication Dose Route Frequency Provider Last Rate Last Admin  . sodium chloride (PF) 0.9 % injection            Current Outpatient Medications  Medication Sig  Dispense Refill  . aspirin 81 MG tablet Take 81 mg by mouth daily.      Marland Kitchen latanoprost (XALATAN) 0.005 % ophthalmic solution Place 1 drop into both eyes at bedtime.    . Multiple Vitamins-Minerals (MULTIVITAMIN WITH MINERALS) tablet Take 1 tablet by mouth daily.      . ramipril (ALTACE) 10 MG capsule Take 10 mg by mouth daily.    . simvastatin (ZOCOR) 40 MG tablet Take 40 mg by mouth at bedtime.      . timolol (TIMOPTIC) 0.5 % ophthalmic solution Place 1 drop into both eyes 2 (two) times daily. Each eye.    Marland Kitchen LUMIGAN 0.01 % SOLN Place 1 drop into the right eye at bedtime.      Allergies: No Known Allergies  Social History: Social History   Tobacco Use  . Smoking status: Former Smoker    Quit date: 02/19/1962    Years since quitting: 57.0  . Smokeless tobacco: Never Used  Substance Use Topics  . Alcohol use: Yes    Alcohol/week: 10.0 standard drinks    Types: 10 drink(s) per week  . Drug use: No    Family History Family History  Problem Relation Age of Onset  . Colon cancer Neg Hx     Review of Systems 10 systems were reviewed and are negative except as noted specifically in the HPI.  Objective   Vital signs in last 24 hours: BP 115/86   Pulse 85   Temp 98.9 F (  37.2 C) (Oral)   Resp (!) 24   Ht 5' 10.5" (1.791 m)   Wt 90.7 kg   SpO2 95%   BMI 28.29 kg/m   Physical Exam General: NAD, A&O, resting, appropriate HEENT: [place]/AT, EOMI, MMM Pulmonary: Normal work of breathing Cardiovascular: HDS, adequate peripheral perfusion Abdomen: Soft, NTTP, nondistended, . GU: No CVA tenderness, PCN incision non tender and healing well Extremities: warm and well perfused Neuro: Appropriate, no focal neurological deficits  Most Recent Labs: Lab Results  Component Value Date   WBC 24.0 (H) 02/18/2019   HGB 9.7 (L) 02/18/2019   HCT 30.2 (L) 02/18/2019   PLT 428 (H) 02/18/2019    Lab Results  Component Value Date   NA 132 (L) 02/18/2019   K 4.6 02/18/2019   CL 98  02/18/2019   CO2 22 02/18/2019   BUN 20 02/18/2019   CREATININE 1.73 (H) 02/18/2019   CALCIUM 8.8 (L) 02/18/2019    No results found for: INR, APTT   IMAGING: CT ABDOMEN PELVIS W CONTRAST  Result Date: 02/18/2019 CLINICAL DATA:  Pain status post ureteral stent placement. EXAM: CT ABDOMEN AND PELVIS WITH CONTRAST TECHNIQUE: Multidetector CT imaging of the abdomen and pelvis was performed using the standard protocol following bolus administration of intravenous contrast. CONTRAST:  156mL OMNIPAQUE IOHEXOL 300 MG/ML  SOLN COMPARISON:  April 27, 2008. FINDINGS: Lower chest: There is a 5 mm pulmonary nodule in the right lower lobe (axial series 4, image 19). There is pleuroparenchymal scarring at the lung bases. There is a 5 mm pulmonary nodule in the left lower lobe (axial series 4, image 13).The heart size is normal. Hepatobiliary: The liver is normal. Normal gallbladder.There is no biliary ductal dilation. Pancreas: Normal contours without ductal dilatation. No peripancreatic fluid collection. Spleen: No splenic laceration or hematoma. Adrenals/Urinary Tract: --Adrenal glands: No adrenal hemorrhage. --Right kidney/ureter: There is mild right-sided hydroureteronephrosis to the level of the high pelvis. There is no evidence for an obstructing lesion. --Left kidney/ureter: There is moderate left-sided hydronephrosis. A ureteral stent is noted and position within the renal pelvis proximally and the urinary bladder distally. There is a nonobstructing stone in the upper pole the left kidney. There is a tract coursing from the posterior interpolar region of the kidney to the patient's back. In the left posterior pararenal space there is a rim enhancing fluid collection measuring approximately 4.8 by 6 cm. There is a smaller component within the left perinephric space. This collection tracks to the posterolateral abdominal wall. There is adjacent fat stranding. --Urinary bladder: There is diffuse bladder wall  thickening with evidence for trabeculation and diverticula. The bladder is moderately distended. Stomach/Bowel: --Stomach/Duodenum: No hiatal hernia or other gastric abnormality. Normal duodenal course and caliber. --Small bowel: No dilatation or inflammation. --Colon: No focal abnormality. --Appendix: Normal. Vascular/Lymphatic: Atherosclerotic calcification is present within the non-aneurysmal abdominal aorta, without hemodynamically significant stenosis. --No retroperitoneal lymphadenopathy. --No mesenteric lymphadenopathy. --there is a 1 cm lymph node along the course of the right common iliac vasculature. Reproductive: The prostate gland is enlarged. Other: There is some reactive free fluid in the left retroperitoneal space. There are bilateral fat containing inguinal hernias, left greater than right. Musculoskeletal. There is a large sclerotic lesion measuring approximately 2.5 cm located within the L2 vertebral body. This was not present on a CT from 2010. There is a cystic lesion involving the L4 vertebral body favored to represent a prominent Schmorl's node. Degenerative changes are noted throughout the visualized lumbar spine with grade 1  anterolisthesis of L4 on L5. IMPRESSION: 1. Relatively well positioned left-sided double-J ureteral stent. There is persistent moderate left-sided hydroureteronephrosis raising concern for stent malfunction. 2. Complex rim enhancing fluid collection in the left posterior perirenal and posterior pararenal spaces. Findings may represent a urinoma status post left-sided nephrostomy tube removal versus a developing abscess in the appropriate clinical setting. 3. Mild right-sided hydronephrosis. 4. Distended urinary bladder with evidence for chronic outlet obstruction. 5. Sclerotic lesion in the L2 vertebral body favored to represent the known metastatic lesion at this level as reported on prior outside imaging. 6. Small 5 mm bilateral pulmonary nodules. Given the patient's  history of metastatic prostate cancer, a repeat CT scan should be considered in approximately 3-6 months. 7. Additional chronic findings as detailed above. Electronically Signed   By: Constance Holster M.D.   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  Although some degree of urinary cavitation is normal after this procedure his white count, malaise, subjective fever are suggestive of infection.  Recommend antibiotics with drainage of the urinoma via aspiration or drain placement.   Recommendations: -Covid test -Admit to urology, n.p.o. at midnight -Consult IR for perinephric drain in AM -Ceftriaxone in the emergency room, broaden if develops fevers    Thank you for this consult. Please contact the urology consult pager with any further questions/concerns.

## 2019-02-19 ENCOUNTER — Encounter (HOSPITAL_COMMUNITY): Payer: Self-pay | Admitting: Urology

## 2019-02-19 ENCOUNTER — Observation Stay (HOSPITAL_COMMUNITY): Payer: Medicare Other

## 2019-02-19 DIAGNOSIS — N151 Renal and perinephric abscess: Secondary | ICD-10-CM | POA: Diagnosis present

## 2019-02-19 DIAGNOSIS — D649 Anemia, unspecified: Secondary | ICD-10-CM | POA: Diagnosis not present

## 2019-02-19 DIAGNOSIS — Z923 Personal history of irradiation: Secondary | ICD-10-CM | POA: Diagnosis not present

## 2019-02-19 DIAGNOSIS — C61 Malignant neoplasm of prostate: Secondary | ICD-10-CM | POA: Diagnosis present

## 2019-02-19 DIAGNOSIS — C7951 Secondary malignant neoplasm of bone: Secondary | ICD-10-CM | POA: Diagnosis present

## 2019-02-19 DIAGNOSIS — I10 Essential (primary) hypertension: Secondary | ICD-10-CM | POA: Diagnosis present

## 2019-02-19 DIAGNOSIS — N179 Acute kidney failure, unspecified: Secondary | ICD-10-CM | POA: Diagnosis not present

## 2019-02-19 DIAGNOSIS — Z87891 Personal history of nicotine dependence: Secondary | ICD-10-CM | POA: Diagnosis not present

## 2019-02-19 DIAGNOSIS — B952 Enterococcus as the cause of diseases classified elsewhere: Secondary | ICD-10-CM | POA: Diagnosis present

## 2019-02-19 DIAGNOSIS — N132 Hydronephrosis with renal and ureteral calculous obstruction: Secondary | ICD-10-CM | POA: Diagnosis present

## 2019-02-19 DIAGNOSIS — E785 Hyperlipidemia, unspecified: Secondary | ICD-10-CM | POA: Diagnosis present

## 2019-02-19 DIAGNOSIS — R918 Other nonspecific abnormal finding of lung field: Secondary | ICD-10-CM | POA: Diagnosis present

## 2019-02-19 DIAGNOSIS — Z20828 Contact with and (suspected) exposure to other viral communicable diseases: Secondary | ICD-10-CM | POA: Diagnosis present

## 2019-02-19 DIAGNOSIS — N368 Other specified disorders of urethra: Secondary | ICD-10-CM | POA: Diagnosis not present

## 2019-02-19 DIAGNOSIS — Z7982 Long term (current) use of aspirin: Secondary | ICD-10-CM | POA: Diagnosis not present

## 2019-02-19 LAB — URINE CULTURE

## 2019-02-19 LAB — CBC
HCT: 28.9 % — ABNORMAL LOW (ref 39.0–52.0)
Hemoglobin: 9.2 g/dL — ABNORMAL LOW (ref 13.0–17.0)
MCH: 31.9 pg (ref 26.0–34.0)
MCHC: 31.8 g/dL (ref 30.0–36.0)
MCV: 100.3 fL — ABNORMAL HIGH (ref 80.0–100.0)
Platelets: 370 10*3/uL (ref 150–400)
RBC: 2.88 MIL/uL — ABNORMAL LOW (ref 4.22–5.81)
RDW: 12.9 % (ref 11.5–15.5)
WBC: 21 10*3/uL — ABNORMAL HIGH (ref 4.0–10.5)
nRBC: 0 % (ref 0.0–0.2)

## 2019-02-19 LAB — BASIC METABOLIC PANEL
Anion gap: 10 (ref 5–15)
BUN: 20 mg/dL (ref 8–23)
CO2: 23 mmol/L (ref 22–32)
Calcium: 8.3 mg/dL — ABNORMAL LOW (ref 8.9–10.3)
Chloride: 99 mmol/L (ref 98–111)
Creatinine, Ser: 1.56 mg/dL — ABNORMAL HIGH (ref 0.61–1.24)
GFR calc Af Amer: 46 mL/min — ABNORMAL LOW (ref >60)
GFR calc non Af Amer: 40 mL/min — ABNORMAL LOW (ref >60)
Glucose, Bld: 181 mg/dL — ABNORMAL HIGH (ref 70–99)
Potassium: 4.6 mmol/L (ref 3.5–5.1)
Sodium: 132 mmol/L — ABNORMAL LOW (ref 135–145)

## 2019-02-19 LAB — LACTIC ACID, PLASMA: Lactic Acid, Venous: 1.2 mmol/L (ref 0.5–1.9)

## 2019-02-19 LAB — SARS CORONAVIRUS 2 (TAT 6-24 HRS): SARS Coronavirus 2: NEGATIVE

## 2019-02-19 MED ORDER — LIDOCAINE HCL (PF) 1 % IJ SOLN
INTRAMUSCULAR | Status: AC | PRN
  Administered 2019-02-19: 10 mL

## 2019-02-19 MED ORDER — NALOXONE HCL 0.4 MG/ML IJ SOLN
INTRAMUSCULAR | Status: AC
  Filled 2019-02-19: qty 1

## 2019-02-19 MED ORDER — FLUMAZENIL 0.5 MG/5ML IV SOLN
INTRAVENOUS | Status: AC
  Filled 2019-02-19: qty 5

## 2019-02-19 MED ORDER — SODIUM CHLORIDE 0.9 % IV SOLN
INTRAVENOUS | Status: AC
  Filled 2019-02-19: qty 250

## 2019-02-19 MED ORDER — MIDAZOLAM HCL 2 MG/2ML IJ SOLN
INTRAMUSCULAR | Status: AC
  Filled 2019-02-19: qty 2

## 2019-02-19 MED ORDER — FENTANYL CITRATE (PF) 100 MCG/2ML IJ SOLN
INTRAMUSCULAR | Status: AC
  Filled 2019-02-19: qty 2

## 2019-02-19 MED ORDER — CHLORHEXIDINE GLUCONATE CLOTH 2 % EX PADS
6.0000 | MEDICATED_PAD | Freq: Every day | CUTANEOUS | Status: DC
  Administered 2019-02-19 – 2019-02-22 (×4): 6 via TOPICAL

## 2019-02-19 MED ORDER — MIDAZOLAM HCL 2 MG/2ML IJ SOLN
INTRAMUSCULAR | Status: AC | PRN
  Administered 2019-02-19 (×2): 1 mg via INTRAVENOUS

## 2019-02-19 MED ORDER — FENTANYL CITRATE (PF) 100 MCG/2ML IJ SOLN
INTRAMUSCULAR | Status: AC | PRN
  Administered 2019-02-19 (×2): 50 ug via INTRAVENOUS

## 2019-02-19 MED ORDER — HEPARIN SODIUM (PORCINE) 5000 UNIT/ML IJ SOLN
5000.0000 [IU] | Freq: Three times a day (TID) | INTRAMUSCULAR | Status: DC
  Administered 2019-02-19 – 2019-02-22 (×9): 5000 [IU] via SUBCUTANEOUS
  Filled 2019-02-19 (×8): qty 1

## 2019-02-19 MED ORDER — SODIUM CHLORIDE 0.9% FLUSH
5.0000 mL | Freq: Three times a day (TID) | INTRAVENOUS | Status: DC
  Administered 2019-02-19 – 2019-02-22 (×8): 5 mL

## 2019-02-19 NOTE — Consult Note (Signed)
Chief Complaint: Patient was seen in consultation today for left perinephric fluid collection/aspiration with possible drain placement.  Referring Physician(s): Raynelle Bring  Supervising Physician: Markus Daft  Patient Status: [facility] - In-pt  History of Present Illness: Levi Cervantes is a 85 y.o. male with a past medical history of hypertension, hyperlipidemia, nephrolithiasis s/p PCNL at [facility] 01/23/2019, prostate cancer, glaucoma, and arthritis. Of note, he was at [facility] for 1 day post-op and had his left PCN removed prior to discharge. He went to his PCP 02/18/2019 and his CBC revealed WBCs elevated to 28K. He was then sent to [facility] ED by his PCP for further evaluation of leukocytosis. In ED, CT abdomen/pelvis revealed moderate left hydroureteronephrosis along with a fluid collection in the left perirenal/posterior pararenal spaces. Urology was consulted who recommended admission and IR consultation for possible left perinephric fluid collection aspiration/drain placement.  CT abdomen/pelvis 02/18/2019: 1. Relatively well positioned left-sided double-J ureteral stent. There is persistent moderate left-sided hydroureteronephrosis raising concern for stent malfunction. 2. Complex rim enhancing fluid collection in the left posterior perirenal and posterior pararenal spaces. Findings may represent a urinoma status post left-sided nephrostomy tube removal versus a developing abscess in the appropriate clinical setting. 3. Mild right-sided hydronephrosis. 4. Distended urinary bladder with evidence for chronic outlet obstruction. 5. Sclerotic lesion in the L2 vertebral body favored to represent the known metastatic lesion at this level as reported on prior outside imaging. 6. Small 5 mm bilateral pulmonary nodules. Given the patient's history of metastatic prostate cancer, a repeat CT scan should be considered in approximately 3-6 months. 7. Additional chronic findings as detailed above.  IR  consulted by Dr. Alinda Money for possible image-guided left perinephric fluid collection aspiration/possible drain placement. Patient awake and alert laying in bed. Complains of "soreness" of back near left kidney. Denies fever, chills, chest pain, dyspnea, abdominal pain, or headache.   Past Medical History:  Diagnosis Date  . Arthritis   . Glaucoma    High interocular pressure  . Hyperlipidemia   . Hypertension   . Prostate cancer [facility]) 2011   Radiation tx    Past Surgical History:  Procedure Laterality Date  . CARPAL TUNNEL RELEASE Right 2011  . COLONOSCOPY W/ BIOPSIES AND POLYPECTOMY  2012  . TONSILLECTOMY  1945    Allergies: Patient has no known allergies.  Medications: Prior to Admission medications   Medication Sig Start Date End Date Taking? Authorizing Provider  aspirin 81 MG tablet Take 81 mg by mouth daily.     Yes [provider]  latanoprost (XALATAN) 0.005 % ophthalmic solution Place 1 drop into both eyes at bedtime.   Yes [provider]  Multiple Vitamins-Minerals (MULTIVITAMIN WITH MINERALS) tablet Take 1 tablet by mouth daily.     Yes [provider]  ramipril (ALTACE) 10 MG capsule Take 10 mg by mouth daily. 01/01/19  Yes [provider]  simvastatin (ZOCOR) 40 MG tablet Take 40 mg by mouth at bedtime.     Yes [provider]  timolol (TIMOPTIC) 0.5 % ophthalmic solution Place 1 drop into both eyes 2 (two) times daily. Each eye. 05/10/10  Yes [provider]  LUMIGAN 0.01 % SOLN Place 1 drop into the right eye at bedtime. 01/22/19   [provider]     Family History  Problem Relation Age of Onset  . Colon cancer Neg Hx     Social History   Socioeconomic History  . Marital status: Married    Spouse name:  Not on file  . Number of children: Not on file  . Years of education: Not on file  . Highest education level: Not on file  Occupational History  . Not on file  Tobacco Use  . Smoking  status: Former Smoker    Quit date: 02/19/1962    Years since quitting: 57.0  . Smokeless tobacco: Never Used  Substance and Sexual Activity  . Alcohol use: Yes    Alcohol/week: 10.0 standard drinks    Types: 10 drink(s) per week  . Drug use: No  . Sexual activity: Not on file  Other Topics Concern  . Not on file  Social History Narrative  . Not on file   Social Determinants of Health   Financial Resource Strain:   . Difficulty of Paying Living Expenses: Not on file  Food Insecurity:   . Worried About Charity fundraiser in the Last Year: Not on file  . Ran Out of Food in the Last Year: Not on file  Transportation Needs:   . Lack of Transportation (Medical): Not on file  . Lack of Transportation (Non-Medical): Not on file  Physical Activity:   . Days of Exercise per Week: Not on file  . Minutes of Exercise per Session: Not on file  Stress:   . Feeling of Stress : Not on file  Social Connections:   . Frequency of Communication with Friends and Family: Not on file  . Frequency of Social Gatherings with Friends and Family: Not on file  . Attends Religious Services: Not on file  . Active Member of Clubs or Organizations: Not on file  . Attends Archivist Meetings: Not on file  . Marital Status: Not on file     Review of Systems: A 12 point ROS discussed and pertinent positives are indicated in the HPI above.  All other systems are negative.  Review of Systems  Constitutional: Negative for chills and fever.  Respiratory: Negative for shortness of breath and wheezing.   Cardiovascular: Negative for chest pain and palpitations.  Gastrointestinal: Negative for abdominal pain.  Neurological: Negative for headaches.  Psychiatric/Behavioral: Negative for behavioral problems and confusion.    Vital Signs: BP 138/70 (BP Location: Left Arm)   Pulse 100   Temp 99.3 F (37.4 C) (Oral)   Resp 18   Ht 5' 10.5" (1.791 m)   Wt 200 lb (90.7 kg)   SpO2 97%   BMI 28.29  kg/m   Physical Exam Vitals and nursing note reviewed.  Constitutional:      General: He is not in acute distress.    Appearance: Normal appearance.  Cardiovascular:     Rate and Rhythm: Normal rate and regular rhythm.     Heart sounds: Normal heart sounds. No murmur.  Pulmonary:     Effort: Pulmonary effort is normal. No respiratory distress.     Breath sounds: Normal breath sounds. No wheezing.  Skin:    General: Skin is warm and dry.  Neurological:     Mental Status: He is alert and oriented to person, place, and time.  Psychiatric:        Mood and Affect: Mood normal.        Behavior: Behavior normal.      MD Evaluation Airway: WNL Heart: WNL Abdomen: WNL Chest/ Lungs: WNL ASA  Classification: 3 Mallampati/Airway Score: Two   Imaging: CT ABDOMEN PELVIS W CONTRAST  Result Date: 02/18/2019 CLINICAL DATA:  Pain status post ureteral stent placement. EXAM: CT  ABDOMEN AND PELVIS WITH CONTRAST TECHNIQUE: Multidetector CT imaging of the abdomen and pelvis was performed using the standard protocol following bolus administration of intravenous contrast. CONTRAST:  139mL OMNIPAQUE IOHEXOL 300 MG/ML  SOLN COMPARISON:  April 27, 2008. FINDINGS: Lower chest: There is a 5 mm pulmonary nodule in the right lower lobe (axial series 4, image 19). There is pleuroparenchymal scarring at the lung bases. There is a 5 mm pulmonary nodule in the left lower lobe (axial series 4, image 13).The heart size is normal. Hepatobiliary: The liver is normal. Normal gallbladder.There is no biliary ductal dilation. Pancreas: Normal contours without ductal dilatation. No peripancreatic fluid collection. Spleen: No splenic laceration or hematoma. Adrenals/Urinary Tract: --Adrenal glands: No adrenal hemorrhage. --Right kidney/ureter: There is mild right-sided hydroureteronephrosis to the level of the high pelvis. There is no evidence for an obstructing lesion. --Left kidney/ureter: There is moderate left-sided  hydronephrosis. A ureteral stent is noted and position within the renal pelvis proximally and the urinary bladder distally. There is a nonobstructing stone in the upper pole the left kidney. There is a tract coursing from the posterior interpolar region of the kidney to the patient's back. In the left posterior pararenal space there is a rim enhancing fluid collection measuring approximately 4.8 by 6 cm. There is a smaller component within the left perinephric space. This collection tracks to the posterolateral abdominal wall. There is adjacent fat stranding. --Urinary bladder: There is diffuse bladder wall thickening with evidence for trabeculation and diverticula. The bladder is moderately distended. Stomach/Bowel: --Stomach/Duodenum: No hiatal hernia or other gastric abnormality. Normal duodenal course and caliber. --Small bowel: No dilatation or inflammation. --Colon: No focal abnormality. --Appendix: Normal. Vascular/Lymphatic: Atherosclerotic calcification is present within the non-aneurysmal abdominal aorta, without hemodynamically significant stenosis. --No retroperitoneal lymphadenopathy. --No mesenteric lymphadenopathy. --there is a 1 cm lymph node along the course of the right common iliac vasculature. Reproductive: The prostate gland is enlarged. Other: There is some reactive free fluid in the left retroperitoneal space. There are bilateral fat containing inguinal hernias, left greater than right. Musculoskeletal. There is a large sclerotic lesion measuring approximately 2.5 cm located within the L2 vertebral body. This was not present on a CT from 2010. There is a cystic lesion involving the L4 vertebral body favored to represent a prominent Schmorl's node. Degenerative changes are noted throughout the visualized lumbar spine with grade 1 anterolisthesis of L4 on L5. IMPRESSION: 1. Relatively well positioned left-sided double-J ureteral stent. There is persistent moderate left-sided  hydroureteronephrosis raising concern for stent malfunction. 2. Complex rim enhancing fluid collection in the left posterior perirenal and posterior pararenal spaces. Findings may represent a urinoma status post left-sided nephrostomy tube removal versus a developing abscess in the appropriate clinical setting. 3. Mild right-sided hydronephrosis. 4. Distended urinary bladder with evidence for chronic outlet obstruction. 5. Sclerotic lesion in the L2 vertebral body favored to represent the known metastatic lesion at this level as reported on prior outside imaging. 6. Small 5 mm bilateral pulmonary nodules. Given the patient's history of metastatic prostate cancer, a repeat CT scan should be considered in approximately 3-6 months. 7. Additional chronic findings as detailed above. Electronically Signed   By: Constance Holster M.D.   On: 02/18/2019 18:29    Labs:  CBC: Recent Labs    02/18/19 1626 02/19/19 0447  WBC 24.0* 21.0*  HGB 9.7* 9.2*  HCT 30.2* 28.9*  PLT 428* 370    COAGS: No results for input(s): INR, APTT in the last 8760 hours.  BMP: Recent Labs    02/18/19 1626 02/19/19 0447  NA 132* 132*  K 4.6 4.6  CL 98 99  CO2 22 23  GLUCOSE 198* 181*  BUN 20 20  CALCIUM 8.8* 8.3*  CREATININE 1.73* 1.56*  GFRNONAA 35* 40*  GFRAA 41* 46*    LIVER FUNCTION TESTS: Recent Labs    02/18/19 1626  BILITOT 0.6  AST 25  ALT 53*  ALKPHOS 113  PROT 6.5  ALBUMIN 2.7*     Assessment and Plan:  Left perinephric fluid collection. Plan for image-guided left perinephric fluid collection aspiration with possible drain placement today in IR. Patient is NPO. Afebrile. He does not take blood thinners.  Risks and benefits discussed with the patient including bleeding, infection, damage to adjacent structures, bowel perforation/fistula connection, and sepsis. All of the patient's questions were answered, patient is agreeable to proceed. Consent signed and in chart.   Thank you  for this interesting consult.  I greatly enjoyed meeting Levi Cervantes and look forward to participating in their care.  A copy of this report was sent to the requesting provider on this date.  Electronically Signed: Earley Abide, PA-C 02/19/2019, 8:28 AM   I spent a total of 40 Minutes in face to face in clinical consultation, greater than 50% of which was counseling/coordinating care for left perinephric fluid collection/aspiration with possible drain placement.

## 2019-02-19 NOTE — Progress Notes (Signed)
Discussed with Dr. Anselm Pancoast.  Patient had purulent return from drain placement.  Drain left in place.  Also appeared to still be communication with renal collecting system suggesting persistent urinoma.  Will proceed with Foley catheter placement to optimize left renal drainage.  Patient expressed understanding.

## 2019-02-19 NOTE — Progress Notes (Signed)
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    Subjective: Did well overnight, no fevers or chills but did have a T-max of 37.9 and has a persistent leukocytosis.  Voiding volitionally.  N.p.o.  Objective: Vital signs in last 24 hours: Temp:  [98.2 F (36.8 C)-100.3 F (37.9 C)] 99.3 F (37.4 C) (12/31 [id number]) Pulse Rate:  [81-101] 100 (12/31 0638) Resp:  [16-27] 18 (12/31 0425) BP: (96-165)/(53-86) 138/70 (12/31 0425) SpO2:  [94 %-100 %] 97 % (12/31 0425) Weight:  [90.7 kg] 90.7 kg (12/30 1527)  Intake/Output from previous day: 12/30 0701 - 12/31 0700 In: 1688.1 [P.O.:250; I.V.:338.1; IV Piggyback:1100] Out: 400 [Urine:400] Intake/Output this shift: No intake/output data recorded.  Physical Exam:  General: Alert and oriented CV: RRR Lungs: Normal work of breathing Ext: NT, No erythema  Lab Results: Recent Labs    02/18/19 1626 02/19/19 0447  HGB 9.7* 9.2*  HCT 30.2* 28.9*   BMET Recent Labs    02/18/19 1626 02/19/19 0447  NA 132* 132*  K 4.6 4.6  CL 98 99  CO2 22 23  GLUCOSE 198* 181*  BUN 20 20  CREATININE 1.73* 1.56*  CALCIUM 8.8* 8.3*     Studies/Results: CT ABDOMEN PELVIS W CONTRAST  Result Date: 02/18/2019 CLINICAL DATA:  Pain status post ureteral stent placement. EXAM: CT ABDOMEN AND PELVIS WITH CONTRAST TECHNIQUE: Multidetector CT imaging of the abdomen and pelvis was performed using the standard protocol following bolus administration of intravenous contrast. CONTRAST:  173mL OMNIPAQUE IOHEXOL 300 MG/ML  SOLN COMPARISON:  April 27, 2008. FINDINGS: Lower chest: There is a 5 mm pulmonary nodule in the right lower lobe (axial series 4, image 19). There is pleuroparenchymal scarring at the lung bases. There is a 5 mm pulmonary nodule in the left lower lobe (axial series 4, image 13).The heart size is normal. Hepatobiliary: The liver is normal. Normal gallbladder.There is no biliary ductal dilation. Pancreas: Normal  contours without ductal dilatation. No peripancreatic fluid collection. Spleen: No splenic laceration or hematoma. Adrenals/Urinary Tract: --Adrenal glands: No adrenal hemorrhage. --Right kidney/ureter: There is mild right-sided hydroureteronephrosis to the level of the high pelvis. There is no evidence for an obstructing lesion. --Left kidney/ureter: There is moderate left-sided hydronephrosis. A ureteral stent is noted and position within the renal pelvis proximally and the urinary bladder distally. There is a nonobstructing stone in the upper pole the left kidney. There is a tract coursing from the posterior interpolar region of the kidney to the patient's back. In the left posterior pararenal space there is a rim enhancing fluid collection measuring approximately 4.8 by 6 cm. There is a smaller component within the left perinephric space. This collection tracks to the posterolateral abdominal wall. There is adjacent fat stranding. --Urinary bladder: There is diffuse bladder wall thickening with evidence for trabeculation and diverticula. The bladder is moderately distended. Stomach/Bowel: --Stomach/Duodenum: No hiatal hernia or other gastric abnormality. Normal duodenal course and caliber. --Small bowel: No dilatation or inflammation. --Colon: No focal abnormality. --Appendix: Normal. Vascular/Lymphatic: Atherosclerotic calcification is present within the non-aneurysmal abdominal aorta, without hemodynamically significant stenosis. --No retroperitoneal lymphadenopathy. --No mesenteric lymphadenopathy. --there is a 1 cm lymph node along the course of the right common iliac vasculature. Reproductive: The prostate gland is enlarged. Other: There is some reactive free fluid in the left retroperitoneal space. There are bilateral fat containing inguinal hernias, left greater than right. Musculoskeletal. There is a large sclerotic lesion measuring approximately 2.5 cm located within  the L2 vertebral body. This was not  present on a CT from 2010. There is a cystic lesion involving the L4 vertebral body favored to represent a prominent Schmorl's node. Degenerative changes are noted throughout the visualized lumbar spine with grade 1 anterolisthesis of L4 on L5. IMPRESSION: 1. Relatively well positioned left-sided double-J ureteral stent. There is persistent moderate left-sided hydroureteronephrosis raising concern for stent malfunction. 2. Complex rim enhancing fluid collection in the left posterior perirenal and posterior pararenal spaces. Findings may represent a urinoma status post left-sided nephrostomy tube removal versus a developing abscess in the appropriate clinical setting. 3. Mild right-sided hydronephrosis. 4. Distended urinary bladder with evidence for chronic outlet obstruction. 5. Sclerotic lesion in the L2 vertebral body favored to represent the known metastatic lesion at this level as reported on prior outside imaging. 6. Small 5 mm bilateral pulmonary nodules. Given the patient's history of metastatic prostate cancer, a repeat CT scan should be considered in approximately 3-6 months. 7. Additional chronic findings as detailed above. Electronically Signed   By: Constance Holster M.D.   On: 02/18/2019 18:29    Assessment/Plan:  85 y.o. 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  He would like to establish care here at [facility] urology.  We will treat his likely developing abscess with drainage and antibiotics.  He will eventually need follow-up for his prostate cancer    -N.p.o. -Hold on DVT prophylaxis until after procedure -Continue ceftriaxone -Consult VIR for left perinephric drain, order placed.    Dispo: floor, will likely need to stay 1 to 2 days after drainage   LOS: 0 days   Tharon Aquas 02/19/2019, 7:16 AM

## 2019-02-19 NOTE — Plan of Care (Signed)

## 2019-02-19 NOTE — Procedures (Signed)
Interventional Radiology Procedure:   Indications: Left perinephric abscess  Procedure: CT guided abscess drain placement  Findings: Contrast in collection likely from left renal collecting system ( leak from prior nephrostomy site).  Placed 10 Fr drain and removed 25 ml of purulent fluid.  Complications: None     EBL: less than 10 ml  Plan: Need follow up CT imaging when output stops or markedly decreases.     Shatasha Lambing R. Anselm Pancoast, MD  Pager: [phone]

## 2019-02-19 NOTE — Discharge Instructions (Addendum)
Percutaneous Abscess Drain, Care After This sheet gives you information about how to care for yourself after your procedure. Your health care provider may also give you more specific instructions. If you have problems or questions, contact your health care provider. What can I expect after the procedure? After your procedure, it is common to have:  A small amount of bruising and discomfort in the area where the drainage tube (catheter) was placed.  Sleepiness and fatigue. This should go away after the medicines you were given have worn off. Follow these instructions at home: Incision care  Follow instructions from your health care provider about how to take care of your incision. Make sure you: ? Wash your hands with soap and water before you change your bandage (dressing). If soap and water are not available, use hand sanitizer. ? Change your dressing as told by your health care provider. ? Leave stitches (sutures), skin glue, or adhesive strips in place. These skin closures may need to stay in place for 2 weeks or longer. If adhesive strip edges start to loosen and curl up, you may trim the loose edges. Do not remove adhesive strips completely unless your health care provider tells you to do that.  Check your incision area every day for signs of infection. Check for: ? More redness, swelling, or pain. ? More fluid or blood. ? Warmth. ? Pus or a bad smell. ? Fluid leaking from around your catheter (instead of fluid draining through your catheter). Catheter care   Follow instructions from your health care provider about emptying and cleaning your catheter and collection bag. You may need to clean the catheter every day so it does not clog.  If directed, write down the following information every time you empty your bag: ? The date and time. ? The amount of drainage. General instructions  Rest at home for 1-2 days after your procedure. Return to your normal activities as told by your  health care provider.  Do not take baths, swim, or use a hot tub for 24 hours after your procedure, or until your health care provider says that this is okay.  Take over-the-counter and prescription medicines only as told by your health care provider.  Keep all follow-up visits as told by your health care provider. This is important. Contact a health care provider if:  You have less than 10 mL of drainage a day for 2-3 days in a row, or as directed by your health care provider.  You have more redness, swelling, or pain around your incision area.  You have more fluid or blood coming from your incision area.  Your incision area feels warm to the touch.  You have pus or a bad smell coming from your incision area.  You have fluid leaking from around your catheter (instead of through your catheter).  You have a fever or chills.  You have pain that does not get better with medicine. Get help right away if:  Your catheter comes out.  You suddenly stop having drainage from your catheter.  You suddenly have blood in the fluid that is draining from your catheter.  You become dizzy or you faint.  You develop a rash.  You have nausea or vomiting.  You have difficulty breathing or you feel short of breath.  You develop chest pain.  You have problems with your speech or vision.  You have trouble balancing or moving your arms or legs. Summary  It is common to have a small   amount of bruising and discomfort in the area where the drainage tube (catheter) was placed.  You may be directed to record the amount of drainage from the bag every time you empty it.  Follow instructions from your health care provider about emptying and cleaning your catheter and collection bag. This information is not intended to replace advice given to you by your health care provider. Make sure you discuss any questions you have with your health care provider. Document Revised: 01/18/2017 Document  Reviewed: 12/29/2015 Elsevier Patient Education  2020 [place] antibiotic course.  Call if fever > 101.

## 2019-02-19 NOTE — Progress Notes (Signed)
Patient in CT receiving drain placement.

## 2019-02-19 NOTE — Plan of Care (Signed)
CONTINUE CURRENT POC

## 2019-02-20 LAB — CBC
HCT: 24.7 % — ABNORMAL LOW (ref 39.0–52.0)
Hemoglobin: 7.8 g/dL — ABNORMAL LOW (ref 13.0–17.0)
MCH: 32 pg (ref 26.0–34.0)
MCHC: 31.6 g/dL (ref 30.0–36.0)
MCV: 101.2 fL — ABNORMAL HIGH (ref 80.0–100.0)
Platelets: 314 10*3/uL (ref 150–400)
RBC: 2.44 MIL/uL — ABNORMAL LOW (ref 4.22–5.81)
RDW: 12.9 % (ref 11.5–15.5)
WBC: 13.1 10*3/uL — ABNORMAL HIGH (ref 4.0–10.5)
nRBC: 0 % (ref 0.0–0.2)

## 2019-02-20 LAB — BASIC METABOLIC PANEL
Anion gap: 8 (ref 5–15)
BUN: 18 mg/dL (ref 8–23)
CO2: 23 mmol/L (ref 22–32)
Calcium: 8 mg/dL — ABNORMAL LOW (ref 8.9–10.3)
Chloride: 103 mmol/L (ref 98–111)
Creatinine, Ser: 1.47 mg/dL — ABNORMAL HIGH (ref 0.61–1.24)
GFR calc Af Amer: 50 mL/min — ABNORMAL LOW (ref >60)
GFR calc non Af Amer: 43 mL/min — ABNORMAL LOW (ref >60)
Glucose, Bld: 132 mg/dL — ABNORMAL HIGH (ref 70–99)
Potassium: 3.8 mmol/L (ref 3.5–5.1)
Sodium: 134 mmol/L — ABNORMAL LOW (ref 135–145)

## 2019-02-20 NOTE — Progress Notes (Signed)
Urology Inpatient Progress Report  Perirenal abscess [N15.1] Perinephric abscess [N15.1]      Intv/Subj: IR placed CT guided drain into urinoma yesterday and foley catheter was placed for maximal drainage. Patient is feeling much better after drainage.    Active Problems:   Perirenal abscess   Perinephric abscess  Current Facility-Administered Medications  Medication Dose Route Frequency Provider Last Rate Last Admin  . 0.9 %  sodium chloride infusion   Intravenous Continuous Tharon Aquas, MD 75 mL/hr at 02/19/19 1035 Restarted at 02/19/19 1035  . acetaminophen (TYLENOL) tablet 650 mg  650 mg Oral Q6H PRN Tharon Aquas, MD   650 mg at 02/19/19 0517  . cefTRIAXone (ROCEPHIN) 1 g in sodium chloride 0.9 % 100 mL IVPB  1 g Intravenous Q24H Tharon Aquas, MD 200 mL/hr at 02/19/19 1739 1 g at 02/19/19 1739  . Chlorhexidine Gluconate Cloth 2 % PADS 6 each  6 each Topical Daily Raynelle Bring, MD   6 each at 02/19/19 1602  . heparin injection 5,000 Units  5,000 Units Subcutaneous Q8H Tharon Aquas, MD   5,000 Units at 02/20/19 [phone]  . latanoprost (XALATAN) 0.005 % ophthalmic solution 1 drop  1 drop Both Eyes QHS Tharon Aquas, MD   1 drop at 02/19/19 2159  . ramipril (ALTACE) capsule 10 mg  10 mg Oral Daily Tharon Aquas, MD   10 mg at 02/19/19 1229  . simvastatin (ZOCOR) tablet 40 mg  40 mg Oral QHS Tharon Aquas, MD   40 mg at 02/19/19 2200  . sodium chloride flush (NS) 0.9 % injection 5 mL  5 mL Intracatheter Q8H Markus Daft, MD   5 mL at 02/20/19 0640  . timolol (TIMOPTIC) 0.5 % ophthalmic solution 1 drop  1 drop Both Eyes BID Tharon Aquas, MD   1 drop at 02/19/19 2159     Objective: Vital: Vitals:   02/19/19 1300 02/19/19 1400 02/19/19 2143 02/20/19 0553  BP: (!) 163/91 120/62 137/70 123/63  Pulse: 97 96 89 76  Resp: 16 14 18 18   Temp: 97.9 F (36.6 C) 98.4 F (36.9 C) 99.6 F (37.6 C) 98.5 F (36.9 C)  TempSrc: Oral Oral Oral Oral  SpO2: 97% 96% 99% 99%  Weight:       Height:       I/Os: I/O last 3 completed shifts: In: 2900.2 [P.O.:690; I.V.:2132.8; Other:10; IV Piggyback:67.4] Out: 2112 [Urine:2100; Drains:12]  Drain: 7/5/JP has >10 mL in bulb on rounds  Physical Exam:  General: Patient is in no apparent distress Lungs: Normal respiratory effort, chest expands symmetrically. GI: The abdomen is soft and nontender without mass. Left flank drain with yellow urine/ss output with small blood clots Foley: draining clear yellow urine Ext: lower extremities symmetric  Lab Results: Recent Labs    02/18/19 1626 02/19/19 0447 02/20/19 0350  WBC 24.0* 21.0* 13.1*  HGB 9.7* 9.2* 7.8*  HCT 30.2* 28.9* 24.7*   Recent Labs    02/18/19 1626 02/19/19 0447 02/20/19 0350  NA 132* 132* 134*  K 4.6 4.6 3.8  CL 98 99 103  CO2 22 23 23   GLUCOSE 198* 181* 132*  BUN 20 20 18   CREATININE 1.73* 1.56* 1.47*  CALCIUM 8.8* 8.3* 8.0*   No results for input(s): LABPT, INR in the last 72 hours. No results for input(s): LABURIN in the last 72 hours. Results for orders placed or performed during the hospital encounter of 02/18/19  Blood culture (routine x 2)     Status: None (Preliminary result)  Collection Time: 02/18/19  4:26 PM   Specimen: BLOOD RIGHT FOREARM  Result Value Ref Range Status   Specimen Description   Final    BLOOD RIGHT FOREARM Performed at [facility] [address], [place], [place] [address]    Special Requests   Final    BOTTLES DRAWN AEROBIC AND ANAEROBIC Blood Culture adequate volume Performed at [facility] [address], [place], [place] [address]    Culture   Final    NO GROWTH < 24 HOURS Performed at [facility] [address], [place], [place] [address]    Report Status PENDING  Incomplete  Blood culture (routine x 2)     Status: None (Preliminary result)   Collection Time: 02/18/19  5:37 PM   Specimen: BLOOD  Result Value Ref Range Status   Specimen Description   Final     BLOOD LEFT ANTECUBITAL Performed at [facility] [address] [place], [place] [address]    Special Requests   Final    BOTTLES DRAWN AEROBIC AND ANAEROBIC Blood Culture results may not be optimal due to an inadequate volume of blood received in culture bottles Performed at [facility] [address], [place], [place] [address]    Culture   Final    NO GROWTH < 24 HOURS Performed at [employer] [address] [place], [place] [address]    Report Status PENDING  Incomplete  Urine culture     Status: Abnormal   Collection Time: 02/18/19  6:10 PM   Specimen: Urine, Random  Result Value Ref Range Status   Specimen Description   Final    URINE, RANDOM Performed at [facility] [address] [place], [place] [address]    Special Requests   Final    NONE Performed at [facility], [place] [address] [place], [place] [address]    Culture MULTIPLE SPECIES PRESENT, SUGGEST RECOLLECTION (A)  Final   Report Status 02/19/2019 FINAL  Final  SARS CORONAVIRUS 2 (TAT 6-24 HRS) Nasopharyngeal Nasopharyngeal Swab     Status: None   Collection Time: 02/18/19  9:22 PM   Specimen: Nasopharyngeal Swab  Result Value Ref Range Status   SARS Coronavirus 2 NEGATIVE NEGATIVE Final    Comment: (NOTE) SARS-CoV-2 target nucleic acids are NOT DETECTED. The SARS-CoV-2 RNA is generally detectable in upper and lower respiratory specimens during the acute phase of infection. Negative results do not preclude SARS-CoV-2 infection, do not rule out co-infections with other pathogens, and should not be used as the sole basis for treatment or other patient management decisions. Negative results must be combined with clinical observations, patient history, and epidemiological information. The expected result is Negative. Fact Sheet for Patients: SugarRoll.be Fact Sheet for Healthcare  Providers: https://www.woods-mathews.com/ This test is not yet approved or cleared by the [place] FDA and  has been authorized for detection and/or diagnosis of SARS-CoV-2 by FDA under an Emergency Use Authorization (EUA). This EUA will remain  in effect (meaning this test can be used) for the duration of the COVID-19 declaration under Section 56 4(b)(1) of the Act, 21 U.S.C. section 360bbb-3(b)(1), unless the authorization is terminated or revoked sooner. Performed at [facility], [place] [address] [place], [place] [address]   Aerobic/Anaerobic Culture (surgical/deep wound)     Status: None (Preliminary result)   Collection Time: 02/19/19  9:27 AM   Specimen: Abscess  Result Value  Ref Range Status   Specimen Description   Final    ABSCESS PERINEPHRIC Performed at [facility] [address] [place], [place] [address]    Special Requests   Final    NONE Performed at [facility], [place] [address], [place], [place] [address]    Gram Stain   Final    MODERATE WBC PRESENT, PREDOMINANTLY PMN RARE GRAM POSITIVE COCCI RARE GRAM NEGATIVE RODS Performed at [facility], [place] [address] [place], [place] [address]    Culture PENDING  Incomplete   Report Status PENDING  Incomplete    Studies/Results: CT ABDOMEN PELVIS W CONTRAST  Result Date: 02/18/2019 CLINICAL DATA:  Pain status post ureteral stent placement. EXAM: CT ABDOMEN AND PELVIS WITH CONTRAST TECHNIQUE: Multidetector CT imaging of the abdomen and pelvis was performed using the standard protocol following bolus administration of intravenous contrast. CONTRAST:  136mL OMNIPAQUE IOHEXOL 300 MG/ML  SOLN COMPARISON:  April 27, 2008. FINDINGS: Lower chest: There is a 5 mm pulmonary nodule in the right lower lobe (axial series 4, image 19). There is pleuroparenchymal scarring at the lung bases. There is a 5 mm pulmonary nodule in the left lower lobe (axial series 4, image 13).The heart  size is normal. Hepatobiliary: The liver is normal. Normal gallbladder.There is no biliary ductal dilation. Pancreas: Normal contours without ductal dilatation. No peripancreatic fluid collection. Spleen: No splenic laceration or hematoma. Adrenals/Urinary Tract: --Adrenal glands: No adrenal hemorrhage. --Right kidney/ureter: There is mild right-sided hydroureteronephrosis to the level of the high pelvis. There is no evidence for an obstructing lesion. --Left kidney/ureter: There is moderate left-sided hydronephrosis. A ureteral stent is noted and position within the renal pelvis proximally and the urinary bladder distally. There is a nonobstructing stone in the upper pole the left kidney. There is a tract coursing from the posterior interpolar region of the kidney to the patient's back. In the left posterior pararenal space there is a rim enhancing fluid collection measuring approximately 4.8 by 6 cm. There is a smaller component within the left perinephric space. This collection tracks to the posterolateral abdominal wall. There is adjacent fat stranding. --Urinary bladder: There is diffuse bladder wall thickening with evidence for trabeculation and diverticula. The bladder is moderately distended. Stomach/Bowel: --Stomach/Duodenum: No hiatal hernia or other gastric abnormality. Normal duodenal course and caliber. --Small bowel: No dilatation or inflammation. --Colon: No focal abnormality. --Appendix: Normal. Vascular/Lymphatic: Atherosclerotic calcification is present within the non-aneurysmal abdominal aorta, without hemodynamically significant stenosis. --No retroperitoneal lymphadenopathy. --No mesenteric lymphadenopathy. --there is a 1 cm lymph node along the course of the right common iliac vasculature. Reproductive: The prostate gland is enlarged. Other: There is some reactive free fluid in the left retroperitoneal space. There are bilateral fat containing inguinal hernias, left greater than right.  Musculoskeletal. There is a large sclerotic lesion measuring approximately 2.5 cm located within the L2 vertebral body. This was not present on a CT from 2010. There is a cystic lesion involving the L4 vertebral body favored to represent a prominent Schmorl's node. Degenerative changes are noted throughout the visualized lumbar spine with grade 1 anterolisthesis of L4 on L5. IMPRESSION: 1. Relatively well positioned left-sided double-J ureteral stent. There is persistent moderate left-sided hydroureteronephrosis raising concern for stent malfunction. 2. Complex rim enhancing fluid collection in the left posterior perirenal and posterior pararenal spaces. Findings may represent a urinoma status post left-sided nephrostomy tube removal versus a developing abscess in the appropriate clinical setting. 3. Mild right-sided hydronephrosis.  4. Distended urinary bladder with evidence for chronic outlet obstruction. 5. Sclerotic lesion in the L2 vertebral body favored to represent the known metastatic lesion at this level as reported on prior outside imaging. 6. Small 5 mm bilateral pulmonary nodules. Given the patient's history of metastatic prostate cancer, a repeat CT scan should be considered in approximately 3-6 months. 7. Additional chronic findings as detailed above. Electronically Signed   By: Constance Holster M.D.   On: 02/18/2019 18:29   CT IMAGE GUIDED FLUID DRAIN BY CATHETER  Result Date: 02/19/2019 INDICATION: 86 year old with history of percutaneous stone procedure and left perinephric collection concerning for urinoma or abscess. EXAM: CT-GUIDED DRAIN PLACEMENT IN LEFT PERINEPHRIC ABSCESS MEDICATIONS: Moderate sedation ANESTHESIA/SEDATION: Fentanyl 100 mcg IV; Versed 2.0 mg IV Moderate Sedation Time:  29 minutes The patient was continuously monitored during the procedure by the interventional radiology nurse under my direct supervision. COMPLICATIONS: None immediate. PROCEDURE: Informed written consent  was obtained from the patient after a thorough discussion of the procedural risks, benefits and alternatives. All questions were addressed. A timeout was performed prior to the initiation of the procedure. Patient was placed prone and CT images through the abdomen were obtained. The left perinephric collection was identified and targeted. The left flank was prepped chlorhexidine and sterile field was created. Skin and soft tissues were anesthetized with 1% lidocaine. Using CT guidance, an 18 gauge trocar needle was directed into the posterior left perinephric fluid collection. Yellow purulent fluid was aspirated. Stiff Amplatz wire was advanced into the collection and the tract was dilated to accommodate a 10.2 [place] multipurpose drain. 25 mL yellow purulent fluid was removed. Catheter was sutured to skin and attached to a suction bulb. Fluid sample was sent for culture. Dressing was placed over the drain. FINDINGS: The initial images demonstrate a small amount of high-density material within the posterior left perinephric collection and most compatible with contrast from the left renal collecting system. Drain was placed within the largest portion of the collection and 25 mL of purulent fluid was obtained. IMPRESSION: CT-guided placement of a drain in the left perinephric abscess. Purulent fluid was removed and sent for culture. CT demonstrates a small amount of contrast in the perinephric collection compatible with a caliceal leak at the old nephrostomy tube site. Electronically Signed   By: Markus Daft M.D.   On: 02/19/2019 12:30    Assessment: 86 y.o. 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  Foley in place for maximal drainage.   Plan: -cultures from CTguided drainage pending -continue IR drain -continue foley until drain output decreases -tailor antibiotics according to culture results which are pending -pt will need prostate cancer  follow up after d/c too -pt to be discharged after culture result are back; may need to go with foley and drain   Jacalyn Lefevre, MD Urology 02/20/2019, 9:24 AM

## 2019-02-20 NOTE — Plan of Care (Signed)
Continue with current POC

## 2019-02-21 LAB — CBC
HCT: 26.1 % — ABNORMAL LOW (ref 39.0–52.0)
Hemoglobin: 8 g/dL — ABNORMAL LOW (ref 13.0–17.0)
MCH: 30.9 pg (ref 26.0–34.0)
MCHC: 30.7 g/dL (ref 30.0–36.0)
MCV: 100.8 fL — ABNORMAL HIGH (ref 80.0–100.0)
Platelets: 313 10*3/uL (ref 150–400)
RBC: 2.59 MIL/uL — ABNORMAL LOW (ref 4.22–5.81)
RDW: 12.8 % (ref 11.5–15.5)
WBC: 9.6 10*3/uL (ref 4.0–10.5)
nRBC: 0 % (ref 0.0–0.2)

## 2019-02-21 MED ORDER — SODIUM CHLORIDE 0.9 % IV SOLN
3.0000 g | Freq: Three times a day (TID) | INTRAVENOUS | Status: DC
  Administered 2019-02-21 – 2019-02-22 (×3): 3 g via INTRAVENOUS
  Filled 2019-02-21 (×2): qty 3
  Filled 2019-02-21 (×2): qty 8
  Filled 2019-02-21: qty 3

## 2019-02-21 NOTE — Progress Notes (Signed)
Subjective/Chief Complaint:  1 - Left Renal Stone - s/p percutaneous surgeyr at [facility] 01/2019 for left renal stone. JJ stent placed and in good position by CT 12/30.  2 - Left Perinephric Abscess / Urinoma - noted by ER CT 12/30. IR drain placed 12/31 + foley. He has stent in place.   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 Urinoma CX 12/31 - GPC, GNR / pending. Placed on empiric rocephin.  Today "Acey" is stable. Fever curve trending down. Remain on rocephin pending further CX data. Perinephric drain output minima (<28mL per day). He is eager to go home.   Objective: Vital signs in last 24 hours: Temp:  [98.6 F (37 C)-99.2 F (37.3 C)] 98.6 F (37 C) (01/02 0556) Pulse Rate:  [75-78] 75 (01/02 0556) Resp:  [16] 16 (01/02 0556) BP: (127-138)/(66-71) 129/66 (01/02 0556) SpO2:  [97 %-99 %] 97 % (01/02 0556) Last BM Date: 02/20/19  Intake/Output from previous day: 01/01 0701 - 01/02 0700 In: 2308.3 [P.O.:480; I.V.:1691.4; IV Piggyback:126.9] Out: 1815 [Urine:1800; Drains:15] Intake/Output this shift: No intake/output data recorded.  General appearance: alert and cooperative Eyes: negative Nose: Nares normal. Septum midline. Mucosa normal. No drainage or sinus tenderness. Throat: lips, mucosa, and tongue normal; teeth and gums normal Neck: supple, symmetrical, trachea midline Back: symmetric, no curvature. ROM normal. No CVA tenderness., left perinephric drain wtih scant output.  Resp: non-labored on room air.  Cardio: Nl rate GI: soft, non-tender; bowel sounds normal; no masses,  no organomegaly Male genitalia: normal, foley in place with non-foul urine.  Extremities: extremities normal, atraumatic, no cyanosis or edema Skin: Skin color, texture, turgor normal. No rashes or lesions Lymph nodes: Cervical, supraclavicular, and axillary nodes normal. Neurologic: Grossly normal  Lab Results:  Recent Labs    02/20/19 0350  02/21/19 0351  WBC 13.1* 9.6  HGB 7.8* 8.0*  HCT 24.7* 26.1*  PLT 314 313   BMET Recent Labs    02/19/19 0447 02/20/19 0350  NA 132* 134*  K 4.6 3.8  CL 99 103  CO2 23 23  GLUCOSE 181* 132*  BUN 20 18  CREATININE 1.56* 1.47*  CALCIUM 8.3* 8.0*   PT/INR No results for input(s): LABPROT, INR in the last 72 hours. ABG No results for input(s): PHART, HCO3 in the last 72 hours.  Invalid input(s): PCO2, PO2  Studies/Results: CT IMAGE GUIDED FLUID DRAIN BY CATHETER  Result Date: 02/19/2019 INDICATION: 86 year old with history of percutaneous stone procedure and left perinephric collection concerning for urinoma or abscess. EXAM: CT-GUIDED DRAIN PLACEMENT IN LEFT PERINEPHRIC ABSCESS MEDICATIONS: Moderate sedation ANESTHESIA/SEDATION: Fentanyl 100 mcg IV; Versed 2.0 mg IV Moderate Sedation Time:  29 minutes The patient was continuously monitored during the procedure by the interventional radiology nurse under my direct supervision. COMPLICATIONS: None immediate. PROCEDURE: Informed written consent was obtained from the patient after a thorough discussion of the procedural risks, benefits and alternatives. All questions were addressed. A timeout was performed prior to the initiation of the procedure. Patient was placed prone and CT images through the abdomen were obtained. The left perinephric collection was identified and targeted. The left flank was prepped chlorhexidine and sterile field was created. Skin and soft tissues were anesthetized with 1% lidocaine. Using CT guidance, an 18 gauge trocar needle was directed into the posterior left perinephric fluid collection. Yellow purulent fluid was aspirated. Stiff Amplatz wire was advanced into the collection and the tract was dilated to accommodate a 10.2 [place]  multipurpose drain. 25 mL yellow purulent fluid was removed. Catheter was sutured to skin and attached to a suction bulb. Fluid sample was sent for culture. Dressing was placed over the  drain. FINDINGS: The initial images demonstrate a small amount of high-density material within the posterior left perinephric collection and most compatible with contrast from the left renal collecting system. Drain was placed within the largest portion of the collection and 25 mL of purulent fluid was obtained. IMPRESSION: CT-guided placement of a drain in the left perinephric abscess. Purulent fluid was removed and sent for culture. CT demonstrates a small amount of contrast in the perinephric collection compatible with a caliceal leak at the old nephrostomy tube site. Electronically Signed   By: Markus Daft M.D.   On: 02/19/2019 12:30    Anti-infectives: Anti-infectives (From admission, onward)   Start     Dose/Rate Route Frequency Ordered Stop   02/19/19 1800  cefTRIAXone (ROCEPHIN) 1 g in sodium chloride 0.9 % 100 mL IVPB     1 g 200 mL/hr over 30 Minutes Intravenous Every 24 hours 02/18/19 2128     02/18/19 1730  cefTRIAXone (ROCEPHIN) 1 g in sodium chloride 0.9 % 100 mL IVPB     1 g 200 mL/hr over 30 Minutes Intravenous  Once 02/18/19 1719 02/18/19 1809      Assessment/Plan:  1 - Left Renal Stone - minimal stone burden, non-obstructing. Keep current stent.   2 - Left Perinephric Abscess / Urinoma - minimal output at present. Leave current drains / tubes untile fever free and CX final.   3 - Urinary / Urinoma Infection - Improving clinically on current ABX, continue untiel CX fom 12/31 final.  Likely DC foley tomorrow if fever free and CX final. Remove perinephric drian after foely as long as outptu remains scant.    Alexis Frock 02/21/2019

## 2019-02-21 NOTE — Progress Notes (Signed)
Referring Physician(s): Raynelle Bring  Supervising Physician: Sandi Mariscal  Patient Status:  [facility] - In-pt  Chief Complaint: None  Subjective:  Left perinephric fluid collection s/p drain placement in IR 02/19/2019 by Dr. Anselm Pancoast. Patient awake and alert laying in bed with no complaints at this time. Drain site c/d/i.   Allergies: Patient has no known allergies.  Medications: Prior to Admission medications   Medication Sig Start Date End Date Taking? Authorizing Provider  aspirin 81 MG tablet Take 81 mg by mouth daily.     Yes [provider]  latanoprost (XALATAN) 0.005 % ophthalmic solution Place 1 drop into both eyes at bedtime.   Yes [provider]  Multiple Vitamins-Minerals (MULTIVITAMIN WITH MINERALS) tablet Take 1 tablet by mouth daily.     Yes [provider]  ramipril (ALTACE) 10 MG capsule Take 10 mg by mouth daily. 01/01/19  Yes [provider]  simvastatin (ZOCOR) 40 MG tablet Take 40 mg by mouth at bedtime.     Yes [provider]  timolol (TIMOPTIC) 0.5 % ophthalmic solution Place 1 drop into both eyes 2 (two) times daily. Each eye. 05/10/10  Yes [provider]  LUMIGAN 0.01 % SOLN Place 1 drop into the right eye at bedtime. 01/22/19   [provider]     Vital Signs: BP 128/75    Pulse 71    Temp 98.8 F (37.1 C) (Oral)    Resp 16    Ht 5' 10.5" (1.791 m)    Wt 200 lb (90.7 kg)    SpO2 98%    BMI 28.29 kg/m   Physical Exam Constitutional:      General: He is not in acute distress.    Appearance: Normal appearance.  Pulmonary:     Effort: Pulmonary effort is normal. No respiratory distress.  Abdominal:     Comments: Drain site without tenderness, erythema, drainage, or active bleeding; approximately 10 cc bloody fluid in suction bulb.  Skin:    General: Skin is warm and dry.  Neurological:     Mental Status: He is alert and oriented to person, place, and time.     Imaging: CT ABDOMEN  PELVIS W CONTRAST  Result Date: 02/18/2019 CLINICAL DATA:  Pain status post ureteral stent placement. EXAM: CT ABDOMEN AND PELVIS WITH CONTRAST TECHNIQUE: Multidetector CT imaging of the abdomen and pelvis was performed using the standard protocol following bolus administration of intravenous contrast. CONTRAST:  168mL OMNIPAQUE IOHEXOL 300 MG/ML  SOLN COMPARISON:  April 27, 2008. FINDINGS: Lower chest: There is a 5 mm pulmonary nodule in the right lower lobe (axial series 4, image 19). There is pleuroparenchymal scarring at the lung bases. There is a 5 mm pulmonary nodule in the left lower lobe (axial series 4, image 13).The heart size is normal. Hepatobiliary: The liver is normal. Normal gallbladder.There is no biliary ductal dilation. Pancreas: Normal contours without ductal dilatation. No peripancreatic fluid collection. Spleen: No splenic laceration or hematoma. Adrenals/Urinary Tract: --Adrenal glands: No adrenal hemorrhage. --Right kidney/ureter: There is mild right-sided hydroureteronephrosis to the level of the high pelvis. There is no evidence for an obstructing lesion. --Left kidney/ureter: There is moderate left-sided hydronephrosis. A ureteral stent is noted and position within the renal pelvis proximally and the urinary bladder distally. There is a nonobstructing stone in the upper pole the left kidney. There is a tract coursing from the posterior interpolar region of the kidney to the patient's back. In the left posterior pararenal space there  is a rim enhancing fluid collection measuring approximately 4.8 by 6 cm. There is a smaller component within the left perinephric space. This collection tracks to the posterolateral abdominal wall. There is adjacent fat stranding. --Urinary bladder: There is diffuse bladder wall thickening with evidence for trabeculation and diverticula. The bladder is moderately distended. Stomach/Bowel: --Stomach/Duodenum: No hiatal hernia or other gastric abnormality.  Normal duodenal course and caliber. --Small bowel: No dilatation or inflammation. --Colon: No focal abnormality. --Appendix: Normal. Vascular/Lymphatic: Atherosclerotic calcification is present within the non-aneurysmal abdominal aorta, without hemodynamically significant stenosis. --No retroperitoneal lymphadenopathy. --No mesenteric lymphadenopathy. --there is a 1 cm lymph node along the course of the right common iliac vasculature. Reproductive: The prostate gland is enlarged. Other: There is some reactive free fluid in the left retroperitoneal space. There are bilateral fat containing inguinal hernias, left greater than right. Musculoskeletal. There is a large sclerotic lesion measuring approximately 2.5 cm located within the L2 vertebral body. This was not present on a CT from 2010. There is a cystic lesion involving the L4 vertebral body favored to represent a prominent Schmorl's node. Degenerative changes are noted throughout the visualized lumbar spine with grade 1 anterolisthesis of L4 on L5. IMPRESSION: 1. Relatively well positioned left-sided double-J ureteral stent. There is persistent moderate left-sided hydroureteronephrosis raising concern for stent malfunction. 2. Complex rim enhancing fluid collection in the left posterior perirenal and posterior pararenal spaces. Findings may represent a urinoma status post left-sided nephrostomy tube removal versus a developing abscess in the appropriate clinical setting. 3. Mild right-sided hydronephrosis. 4. Distended urinary bladder with evidence for chronic outlet obstruction. 5. Sclerotic lesion in the L2 vertebral body favored to represent the known metastatic lesion at this level as reported on prior outside imaging. 6. Small 5 mm bilateral pulmonary nodules. Given the patient's history of metastatic prostate cancer, a repeat CT scan should be considered in approximately 3-6 months. 7. Additional chronic findings as detailed above. Electronically Signed    By: Constance Holster M.D.   On: 02/18/2019 18:29   CT IMAGE GUIDED FLUID DRAIN BY CATHETER  Result Date: 02/19/2019 INDICATION: 86 year old with history of percutaneous stone procedure and left perinephric collection concerning for urinoma or abscess. EXAM: CT-GUIDED DRAIN PLACEMENT IN LEFT PERINEPHRIC ABSCESS MEDICATIONS: Moderate sedation ANESTHESIA/SEDATION: Fentanyl 100 mcg IV; Versed 2.0 mg IV Moderate Sedation Time:  29 minutes The patient was continuously monitored during the procedure by the interventional radiology nurse under my direct supervision. COMPLICATIONS: None immediate. PROCEDURE: Informed written consent was obtained from the patient after a thorough discussion of the procedural risks, benefits and alternatives. All questions were addressed. A timeout was performed prior to the initiation of the procedure. Patient was placed prone and CT images through the abdomen were obtained. The left perinephric collection was identified and targeted. The left flank was prepped chlorhexidine and sterile field was created. Skin and soft tissues were anesthetized with 1% lidocaine. Using CT guidance, an 18 gauge trocar needle was directed into the posterior left perinephric fluid collection. Yellow purulent fluid was aspirated. Stiff Amplatz wire was advanced into the collection and the tract was dilated to accommodate a 10.2 [place] multipurpose drain. 25 mL yellow purulent fluid was removed. Catheter was sutured to skin and attached to a suction bulb. Fluid sample was sent for culture. Dressing was placed over the drain. FINDINGS: The initial images demonstrate a small amount of high-density material within the posterior left perinephric collection and most compatible with contrast from the left renal collecting system. Drain was  placed within the largest portion of the collection and 25 mL of purulent fluid was obtained. IMPRESSION: CT-guided placement of a drain in the left perinephric abscess.  Purulent fluid was removed and sent for culture. CT demonstrates a small amount of contrast in the perinephric collection compatible with a caliceal leak at the old nephrostomy tube site. Electronically Signed   By: Markus Daft M.D.   On: 02/19/2019 12:30    Labs:  CBC: Recent Labs    02/18/19 1626 02/19/19 0447 02/20/19 0350 02/21/19 0351  WBC 24.0* 21.0* 13.1* 9.6  HGB 9.7* 9.2* 7.8* 8.0*  HCT 30.2* 28.9* 24.7* 26.1*  PLT 428* 370 314 313    COAGS: No results for input(s): INR, APTT in the last 8760 hours.  BMP: Recent Labs    02/18/19 1626 02/19/19 0447 02/20/19 0350  NA 132* 132* 134*  K 4.6 4.6 3.8  CL 98 99 103  CO2 22 23 23   GLUCOSE 198* 181* 132*  BUN 20 20 18   CALCIUM 8.8* 8.3* 8.0*  CREATININE 1.73* 1.56* 1.47*  GFRNONAA 35* 40* 43*  GFRAA 41* 46* 50*    LIVER FUNCTION TESTS: Recent Labs    02/18/19 1626  BILITOT 0.6  AST 25  ALT 53*  ALKPHOS 113  PROT 6.5  ALBUMIN 2.7*    Assessment and Plan:  Left perinephric fluid collection s/p drain placement in IR 02/19/2019 by Dr. Anselm Pancoast. Drain stable. Continue current drain management. Further plans per urology- appreciate and agree with management. IR to follow.   Electronically Signed: Earley Abide, PA-C 02/21/2019, 2:33 PM   I spent a total of 25 Minutes at the the patient's bedside AND on the patient's hospital floor or unit, greater than 50% of which was counseling/coordinating care for left perinephric fluid collection s/p drain.

## 2019-02-22 DIAGNOSIS — N179 Acute kidney failure, unspecified: Secondary | ICD-10-CM | POA: Diagnosis present

## 2019-02-22 DIAGNOSIS — D649 Anemia, unspecified: Secondary | ICD-10-CM | POA: Diagnosis present

## 2019-02-22 MED ORDER — AMPICILLIN 500 MG PO CAPS: 500.0000 mg | ORAL_CAPSULE | Freq: Four times a day (QID) | ORAL | 0 refills | Status: DC

## 2019-02-22 NOTE — Discharge Summary (Signed)
Physician Discharge Summary  Patient ID: Levi Cervantes MRN: [id number] DOB/AGE: 1931/10/14 86 y.o.  Admit date: 02/18/2019 Discharge date: 02/22/2019  Admission Diagnoses:  Perinephric abscess  Discharge Diagnoses:  Principal Problem:   Perinephric abscess Active Problems:   Perirenal abscess   AKI (acute kidney injury) ([place])   Anemia   Past Medical History:  Diagnosis Date  . Arthritis   . Glaucoma    High interocular pressure  . Hyperlipidemia   . Hypertension   . Prostate cancer [facility]) 2011   Radiation tx    Surgeries:  on * No surgery found *   Consultants (if any): Treatment Team:  Raynelle Bring, MD  Discharged Condition: Improved  Hospital Course: Levi Cervantes is an 86 y.o. male who was admitted 02/18/2019 with a diagnosis of Perinephric abscess following a left PCNL on 01/23/19 at [facility] and had a left nephrostomy tube placed. He has recovered well and is afebrile on Unasyn and Rocephin.  The JP  drainage is minimal.  His culture grew enterococcus sens to ampicillin.  He had AKI on admission and the Cr has fallen from 1.73 to 1.47.  His leukocytosis has resolved but he is anemic with a Hgb of 8.0.   His foley and IV were removed on 1/3 and he was discharged on ampicillin with the drain in place with f/u to be scheduled with Dr. Alinda Money for removal of the ureteral stent.  He reports prior issues with urinary retention but is able to self cath if need be and has catheters at home.    He was given perioperative antibiotics:  Anti-infectives (From admission, onward)   Start     Dose/Rate Route Frequency Ordered Stop   02/22/19 0000  ampicillin (PRINCIPEN) 500 MG capsule     500 mg Oral 4 times daily 02/22/19 0938     02/21/19 1400  Ampicillin-Sulbactam (UNASYN) 3 g in sodium chloride 0.9 % 100 mL IVPB     3 g 200 mL/hr over 30 Minutes Intravenous Every 8 hours 02/21/19 1253     02/19/19 1800  cefTRIAXone (ROCEPHIN) 1 g in sodium chloride 0.9 % 100 mL IVPB  Status:   Discontinued     1 g 200 mL/hr over 30 Minutes Intravenous Every 24 hours 02/18/19 2128 02/21/19 1253   02/18/19 1730  cefTRIAXone (ROCEPHIN) 1 g in sodium chloride 0.9 % 100 mL IVPB     1 g 200 mL/hr over 30 Minutes Intravenous  Once 02/18/19 1719 02/18/19 1809    .  He was given sequential compression devices for DVT prophylaxis.  He benefited maximally from the hospital stay and there were no complications.    Recent vital signs:  Vitals:   02/21/19 2053 02/22/19 0508  BP: 132/76 135/69  Pulse: 69 74  Resp: 16 16  Temp: 98.2 F (36.8 C) 97.8 F (36.6 C)  SpO2: 96% 96%    Recent laboratory studies:  Lab Results  Component Value Date   HGB 8.0 (L) 02/21/2019   HGB 7.8 (L) 02/20/2019   HGB 9.2 (L) 02/19/2019   Lab Results  Component Value Date   WBC 9.6 02/21/2019   PLT 313 02/21/2019   No results found for: INR Lab Results  Component Value Date   NA 134 (L) 02/20/2019   K 3.8 02/20/2019   CL 103 02/20/2019   CO2 23 02/20/2019   BUN 18 02/20/2019   CREATININE 1.47 (H) 02/20/2019   GLUCOSE 132 (H) 02/20/2019    Discharge Medications:  Allergies as of 02/22/2019   No Known Allergies     Medication List    STOP taking these medications   Lumigan 0.01 % Soln Generic drug: bimatoprost     TAKE these medications   ampicillin 500 MG capsule Commonly known as: PRINCIPEN Take 1 capsule (500 mg total) by mouth 4 (four) times daily.   aspirin 81 MG tablet Take 81 mg by mouth daily.   latanoprost 0.005 % ophthalmic solution Commonly known as: XALATAN Place 1 drop into both eyes at bedtime.   multivitamin with minerals tablet Take 1 tablet by mouth daily.   ramipril 10 MG capsule Commonly known as: ALTACE Take 10 mg by mouth daily.   simvastatin 40 MG tablet Commonly known as: ZOCOR Take 40 mg by mouth at bedtime.   timolol 0.5 % ophthalmic solution Commonly known as: TIMOPTIC Place 1 drop into both eyes 2 (two) times daily. Each eye.        Diagnostic Studies: CT ABDOMEN PELVIS W CONTRAST  Result Date: 02/18/2019 CLINICAL DATA:  Pain status post ureteral stent placement. EXAM: CT ABDOMEN AND PELVIS WITH CONTRAST TECHNIQUE: Multidetector CT imaging of the abdomen and pelvis was performed using the standard protocol following bolus administration of intravenous contrast. CONTRAST:  159mL OMNIPAQUE IOHEXOL 300 MG/ML  SOLN COMPARISON:  April 27, 2008. FINDINGS: Lower chest: There is a 5 mm pulmonary nodule in the right lower lobe (axial series 4, image 19). There is pleuroparenchymal scarring at the lung bases. There is a 5 mm pulmonary nodule in the left lower lobe (axial series 4, image 13).The heart size is normal. Hepatobiliary: The liver is normal. Normal gallbladder.There is no biliary ductal dilation. Pancreas: Normal contours without ductal dilatation. No peripancreatic fluid collection. Spleen: No splenic laceration or hematoma. Adrenals/Urinary Tract: --Adrenal glands: No adrenal hemorrhage. --Right kidney/ureter: There is mild right-sided hydroureteronephrosis to the level of the high pelvis. There is no evidence for an obstructing lesion. --Left kidney/ureter: There is moderate left-sided hydronephrosis. A ureteral stent is noted and position within the renal pelvis proximally and the urinary bladder distally. There is a nonobstructing stone in the upper pole the left kidney. There is a tract coursing from the posterior interpolar region of the kidney to the patient's back. In the left posterior pararenal space there is a rim enhancing fluid collection measuring approximately 4.8 by 6 cm. There is a smaller component within the left perinephric space. This collection tracks to the posterolateral abdominal wall. There is adjacent fat stranding. --Urinary bladder: There is diffuse bladder wall thickening with evidence for trabeculation and diverticula. The bladder is moderately distended. Stomach/Bowel: --Stomach/Duodenum: No hiatal  hernia or other gastric abnormality. Normal duodenal course and caliber. --Small bowel: No dilatation or inflammation. --Colon: No focal abnormality. --Appendix: Normal. Vascular/Lymphatic: Atherosclerotic calcification is present within the non-aneurysmal abdominal aorta, without hemodynamically significant stenosis. --No retroperitoneal lymphadenopathy. --No mesenteric lymphadenopathy. --there is a 1 cm lymph node along the course of the right common iliac vasculature. Reproductive: The prostate gland is enlarged. Other: There is some reactive free fluid in the left retroperitoneal space. There are bilateral fat containing inguinal hernias, left greater than right. Musculoskeletal. There is a large sclerotic lesion measuring approximately 2.5 cm located within the L2 vertebral body. This was not present on a CT from 2010. There is a cystic lesion involving the L4 vertebral body favored to represent a prominent Schmorl's node. Degenerative changes are noted throughout the visualized lumbar spine with grade 1 anterolisthesis of L4 on L5.  IMPRESSION: 1. Relatively well positioned left-sided double-J ureteral stent. There is persistent moderate left-sided hydroureteronephrosis raising concern for stent malfunction. 2. Complex rim enhancing fluid collection in the left posterior perirenal and posterior pararenal spaces. Findings may represent a urinoma status post left-sided nephrostomy tube removal versus a developing abscess in the appropriate clinical setting. 3. Mild right-sided hydronephrosis. 4. Distended urinary bladder with evidence for chronic outlet obstruction. 5. Sclerotic lesion in the L2 vertebral body favored to represent the known metastatic lesion at this level as reported on prior outside imaging. 6. Small 5 mm bilateral pulmonary nodules. Given the patient's history of metastatic prostate cancer, a repeat CT scan should be considered in approximately 3-6 months. 7. Additional chronic findings as  detailed above. Electronically Signed   By: Constance Holster M.D.   On: 02/18/2019 18:29   CT IMAGE GUIDED FLUID DRAIN BY CATHETER  Result Date: 02/19/2019 INDICATION: 86 year old with history of percutaneous stone procedure and left perinephric collection concerning for urinoma or abscess. EXAM: CT-GUIDED DRAIN PLACEMENT IN LEFT PERINEPHRIC ABSCESS MEDICATIONS: Moderate sedation ANESTHESIA/SEDATION: Fentanyl 100 mcg IV; Versed 2.0 mg IV Moderate Sedation Time:  29 minutes The patient was continuously monitored during the procedure by the interventional radiology nurse under my direct supervision. COMPLICATIONS: None immediate. PROCEDURE: Informed written consent was obtained from the patient after a thorough discussion of the procedural risks, benefits and alternatives. All questions were addressed. A timeout was performed prior to the initiation of the procedure. Patient was placed prone and CT images through the abdomen were obtained. The left perinephric collection was identified and targeted. The left flank was prepped chlorhexidine and sterile field was created. Skin and soft tissues were anesthetized with 1% lidocaine. Using CT guidance, an 18 gauge trocar needle was directed into the posterior left perinephric fluid collection. Yellow purulent fluid was aspirated. Stiff Amplatz wire was advanced into the collection and the tract was dilated to accommodate a 10.2 [place] multipurpose drain. 25 mL yellow purulent fluid was removed. Catheter was sutured to skin and attached to a suction bulb. Fluid sample was sent for culture. Dressing was placed over the drain. FINDINGS: The initial images demonstrate a small amount of high-density material within the posterior left perinephric collection and most compatible with contrast from the left renal collecting system. Drain was placed within the largest portion of the collection and 25 mL of purulent fluid was obtained. IMPRESSION: CT-guided placement of a drain  in the left perinephric abscess. Purulent fluid was removed and sent for culture. CT demonstrates a small amount of contrast in the perinephric collection compatible with a caliceal leak at the old nephrostomy tube site. Electronically Signed   By: Markus Daft M.D.   On: 02/19/2019 12:30    Disposition: Discharge disposition: 01-Home or Self Care       Discharge Instructions    Discontinue IV   Complete by: As directed       Follow-up Information    Raynelle Bring, MD Follow up.   Specialty: Urology Why: Our office will call to schedule for a couple of weeks from now. Contact information: [place] [id number] [phone]            Signed: Irine Seal 02/22/2019, 9:45 AM

## 2019-02-22 NOTE — Plan of Care (Signed)
  Problem: Activity: Goal: Risk for activity intolerance will decrease Outcome: Progressing   Problem: Nutrition: Goal: Adequate nutrition will be maintained Outcome: Progressing   Problem: Elimination: Goal: Will not experience complications related to bowel motility Outcome: Progressing   

## 2019-02-22 NOTE — Plan of Care (Signed)
Pt d/c home with family in stable condition.

## 2019-02-22 NOTE — Progress Notes (Signed)
Pt to d/c with jp drain and family educated on care and emptying of jp drain and site as well. No questions on d/c instructions given. Pt denied pain and had no signs of distress at time of d/c.

## 2019-02-22 NOTE — Progress Notes (Signed)
Pt to d/c with this drain

## 2019-02-23 LAB — CULTURE, BLOOD (ROUTINE X 2)
Culture: NO GROWTH
Culture: NO GROWTH
Special Requests: ADEQUATE

## 2019-02-24 LAB — AEROBIC/ANAEROBIC CULTURE W GRAM STAIN (SURGICAL/DEEP WOUND)

## 2019-03-06 DIAGNOSIS — N200 Calculus of kidney: Secondary | ICD-10-CM | POA: Diagnosis not present

## 2019-03-06 DIAGNOSIS — C61 Malignant neoplasm of prostate: Secondary | ICD-10-CM | POA: Diagnosis not present

## 2019-03-06 DIAGNOSIS — N151 Renal and perinephric abscess: Secondary | ICD-10-CM | POA: Diagnosis not present

## 2019-03-06 DIAGNOSIS — N390 Urinary tract infection, site not specified: Secondary | ICD-10-CM | POA: Diagnosis not present

## 2019-03-06 DIAGNOSIS — Z5111 Encounter for antineoplastic chemotherapy: Secondary | ICD-10-CM | POA: Diagnosis not present

## 2019-03-06 DIAGNOSIS — N131 Hydronephrosis with ureteral stricture, not elsewhere classified: Secondary | ICD-10-CM | POA: Diagnosis not present

## 2019-03-06 DIAGNOSIS — N132 Hydronephrosis with renal and ureteral calculous obstruction: Secondary | ICD-10-CM | POA: Diagnosis not present

## 2019-03-19 ENCOUNTER — Other Ambulatory Visit: Payer: Self-pay | Admitting: Urology

## 2019-03-26 ENCOUNTER — Other Ambulatory Visit: Payer: Self-pay

## 2019-03-26 ENCOUNTER — Encounter (HOSPITAL_BASED_OUTPATIENT_CLINIC_OR_DEPARTMENT_OTHER): Payer: Self-pay | Admitting: Urology

## 2019-03-26 NOTE — Progress Notes (Addendum)
Spoke w/ via phone for pre-op interview---[facility] needs dos----   I STAT 8, EKG REQUESTED FROM [facility] DONE 11-25-2018           Lab results------ COVID test ------03-30-2019 Arrive at -------1115 AM 04-02-2019 NPO after ------MIDNIGHT, FOOD, CLEAR LIQUIDS UNTIL 700 AM THEN NPO Medications to take morning of surgery ----TOMOLOL EYE DROP- Diabetic medication -----N/A Patient Special Instructions ----- Pre-Op special Istructions ----- Patient verbalized understanding of instructions that were given at this phone interview. Patient denies shortness of breath, chest pain, fever, cough a this phone interview.  Anesthesia : left anterior fascular block on ekg 11-25-2018 with tracing done at [facility] , spoke with [place] zanetto pa ok to proceed.  PCP: dr Marton Redwood Cardiologist :none Chest x-ray :none EKG :ekg received from [facility] and placed on chart done 11-25-2018 Echo :none Cardiac Cath : none Sleep Study/ CPAP :n/a Fasting Blood Sugar :      / Checks Blood Sugar -- times a day:  n/a Blood Thinner/ Instructions /Last Dose:n/a ASA / Instructions/ Last Dose : stopped 81 mg aspirin 03-22-2019 per dr borden instructions  Patient denies shortness of breath, chest pain, fever, and cough at this phone interview.

## 2019-03-30 ENCOUNTER — Other Ambulatory Visit (HOSPITAL_COMMUNITY)
Admission: RE | Admit: 2019-03-30 | Discharge: 2019-03-30 | Disposition: A | Discharge status: Home / Self Care | Payer: Medicare Other | Source: Ambulatory Visit | Attending: Urology | Admitting: Urology

## 2019-03-30 DIAGNOSIS — Z20822 Contact with and (suspected) exposure to covid-19: Secondary | ICD-10-CM | POA: Diagnosis not present

## 2019-03-30 DIAGNOSIS — Z01812 Encounter for preprocedural laboratory examination: Secondary | ICD-10-CM | POA: Diagnosis not present

## 2019-03-30 LAB — SARS CORONAVIRUS 2 (TAT 6-24 HRS): SARS Coronavirus 2: NEGATIVE

## 2019-04-01 ENCOUNTER — Encounter (HOSPITAL_BASED_OUTPATIENT_CLINIC_OR_DEPARTMENT_OTHER): Payer: Self-pay | Admitting: Urology

## 2019-04-01 NOTE — H&P (Signed)
Office Visit Report     03/06/2019   --------------------------------------------------------------------------------   Levi Cervantes  MRN: [id number]  DOB: 04-21-1931, 86 year old Male  SSN: [id number]   PRIMARY CARE:  W Gaylan Gerold, MD  REFERRING:  Patrina Levering, MD  PROVIDER:  Nicolette Bang, M.D.  TREATING:  Raynelle Bring, M.D.  LOCATION:  [facility] [phone]     --------------------------------------------------------------------------------   CC/HPI: 1. Perinephric abscess  2. Urolithiasis  3. Possible left ureteral stricture/obstruction  4. Metastatic prostate cancer   Levi Cervantes is an 86 year old gentleman with a recently evaluated in the hospital after presenting to the emergency department. He does have a history of biochemically recurrent prostate cancer status post primary radiation therapy in September 2010. His PSA has been monitored by his primary urologist, Dr. Lamonte Richer. This had increased to 44.34 in August of this past year prompting a fluciclovine PET scan in September 2020 that demonstrated pelvic lymphadenopathy and an L2 vertebral lesion confirming measurable metastatic disease. He began systemic therapy with androgen deprivation in October 2020 with Eligard 22.5 mg. He has tolerated this therapy exceedingly well. He denies any hot flashes and has not noted his significant change in his energy level. He denies any weight loss or weight gain. His last injection was on 11/27/2018 and he is due for further therapy. He has not yet had a PSA after starting therapy. He remains completely asymptomatic and denies any pain symptoms.   Incidentally, his PET scan did demonstrate a distal left ureteral calculus along with a left renal calculus. He underwent 2 attempted retrograde ureteroscopic procedures by Dr. Rosana Hoes in the fall that were unsuccessful with an inability to access the distal left ureter. This was at least partially due to his very  large prostate although it was determined that he might have a distal left ureteral stricture. He was then seen by Dr. Lynnae Sandhoff who performed antegrade percutaneous ureteroscopy to treat his left ureteral stone. A left ureteral stent was left in place. He subsequently had low-grade fever and flank pain and presented to the [facility] emergency department recently. CT imaging revealed a left perinephric abscess. He underwent percutaneous drain placement. His culture was positive and he was treated with amoxicillin that he has now completed. He follows up today with repeat CT imaging to determine if his drain can be removed. His drain has had scant output over the past few days. He denies any recent fever. He denies pain except for some pain around the drain site as expected. He continues to have an indwelling left ureteral stent.     ALLERGIES: No Allergies    MEDICATIONS: Aspirin  Latanoprost  Ramipril  Simvastatin 80 mg tablet Oral  Timolol Maleate 0.5 % drops Ophthalmic     GU PSH: None     PSH Notes: kidney stone surgery, unspecified    NON-GU PSH: None   GU PMH: Bladder-neck stenosis/contracture, Bladder neck obstruction - 2014 BPH w/o LUTS, Benign prostatic hypertrophy without lower urinary tract symptoms - 2014 Elevated PSA, Elevated prostate specific antigen (PSA) - 2014 Prostate Cancer, Prostate cancer - 2014 Prostate nodule w/o LUTS, Nodular prostate without lower urinary tract symptoms - 2014      PMH Notes:   Levi Cervantes has previously received care by Dr. Lawerance Bach until establishing with me in December 2020.   1) Prostate cancer: He was initially diagnosed with Gleason 4+3=7 adenocarcinoma of the prostate with a PSA of 10.99 at diagnosis  and underwent primary treatment with degarelix (for downsizing of the prostate) and IMRT completed in September 2010. His PSA nadir was 0.54 in November 2011 but subsequently began increasing with confirmation of biochemical recurrence in  March 2015. He continued to undergo PSA monitoring until August 2020 when his PSA was 44.34 prompting a fluciclovine PET scan in September 2020 that demonstrated pelvic lymphadenopathy and an L2 vertebral lesion confirming measurable metastatic disease. He began androgen deprivation in October 2020.   Oct 2020: Began ADT with Eligard   2) Urolithiasis/left ureteral stricture: He has a reported history of a distal left ureteral stricture and was found to have a distal left ureteral calculus and renal stone in the fall of 2020 on a staging PET imaging study for his prostate cancer. Attempts at retrograde ureteroscopy were unsuccessful by Dr. Rosana Hoes due to his stricture. He eventually underwent a left PCNL by Dr. Roby Lofts with antegrade treatment of his stones in December 2020. An indwelling stent was left in place. He presented to [facility] ED in late December with low grade fever and pain. Imaging revealed a left perinephric abscess with urinoma and persistent urine leak. He underwent percutaneous drainage and antibiotic therapy. His urine leak appeared resolved after urethral catheter placement.   3) Left ureteral stricture: This was noted incidentally in the fall of 2020 when he underwent attempted ureteroscopic treatment of a distal left ureteral stone.   NON-GU PMH: Glaucoma Hypercholesterolemia Hypertension    FAMILY HISTORY: Family Health Status Number - Runs In Family Lung Cancer - Father   SOCIAL HISTORY: Marital Status: Married Preferred Language: English; Ethnicity: Not Hispanic Or Latino; Race: White Current Smoking Status: Patient does not smoke anymore. Has not smoked since 02/20/1964. Smoked for 5 years. Smoked less than 1/2 pack per day.   Tobacco Use Assessment Completed: Used Tobacco in last 30 days? Does not drink anymore.  Drinks 3 caffeinated drinks per day.     Notes: Caffeine Use, Previous History Of Smoking, Marital History - Currently Married, Alcohol Use    REVIEW OF SYSTEMS:    GU Review Male:   Patient reports frequent urination and get up at night to urinate. Patient denies hard to postpone urination, burning/ pain with urination, leakage of urine, stream starts and stops, trouble starting your streams, and have to strain to urinate .  Gastrointestinal (Upper):   Patient denies nausea and vomiting.  Gastrointestinal (Lower):   Patient denies diarrhea and constipation.  Constitutional:   Patient denies fever, night sweats, weight loss, and fatigue.  Skin:   Patient denies skin rash/ lesion and itching.  Eyes:   Patient denies blurred vision and double vision.  Ears/ Nose/ Throat:   Patient denies sore throat and sinus problems.  Hematologic/Lymphatic:   Patient denies swollen glands and easy bruising.  Cardiovascular:   Patient denies leg swelling and chest pains.  Respiratory:   Patient denies cough and shortness of breath.  Endocrine:   Patient denies excessive thirst.  Musculoskeletal:   Patient denies back pain and joint pain.  Neurological:   Patient denies headaches and dizziness.  Psychologic:   Patient denies depression and anxiety.   VITAL SIGNS:      03/06/2019 02:47 PM  Weight 200 lb / 90.72 kg  Height 70.5 in / 179.07 cm  BP 156/77 mmHg  Pulse 80 /min  Temperature 97.8 F / 36.5 C  BMI 28.3 kg/m   MULTI-SYSTEM PHYSICAL EXAMINATION:    Constitutional: Well-nourished. No physical deformities. Normally developed. Good grooming.  Notes: His drain is exiting his left flank with minimal serous fluid noted in the bulb. He states that this was last empty 2 days ago.     PAST DATA REVIEWED:  Source Of History:  Patient  X-Ray Review: C.T. Abdomen/Pelvis: Reviewed Films.     06/21/09 01/27/09  PSA  Total PSA 0.43  0.05    Notes:                     CT ABDOMEN AND PELVIS WITHOUT CONTRAST 03/06/2019   --------------------------------------------------------------------------------   Levi Cervantes  MRN: [id number] Ordering  Provider: Raynelle Bring, JR  DOB: Dec 27, 1931 Date Collected: 03/06/2019 11:15:00  SSN: [id number] Date Completed: 03/06/2019 15:13:41   --------------------------------------------------------------------------------   CLINICAL DATA: Perinephric abscess. Metastatic prostate cancer.   EXAM:  CT ABDOMEN AND PELVIS WITHOUT CONTRAST   TECHNIQUE:  Multidetector CT imaging of the abdomen and pelvis was performed  following the standard protocol without IV contrast.   COMPARISON: CT scan 02/18/2019   FINDINGS:  Lower chest: 2 small right lower lobe nodules are stable, the larger  measuring up to 0.5 cm in diameter. Stable 0.5 cm left lower lobe  nodule, image 5/3. Stable peripheral reticular opacities in the lung  bases.   Hepatobiliary: Contracted gallbladder, otherwise unremarkable.   Pancreas: Unremarkable   Spleen: Unremarkable   Adrenals/Urinary Tract: Both adrenal glands appear normal.   Mild right hydronephrosis with right hydroureter extending down to  the mid ureter. The hydroureter resolves at this level without  obvious stone or other cause. Similar appearance to the prior exam.   On the left side there is a double-J ureteral stent with proximal  distal loops formed in the collecting system and urinary bladder.  Despite this there is moderate hydroureter and mild to moderate  hydronephrosis, query stent malfunction. A left kidney upper pole  0.9 cm renal calculus is present on image 26/2.   There is a percutaneous drainage catheter with pigtail formed in the  retrorenal space. There is some continued thickening along the  posterior renal fascia for example on images 30-37, with some mild  stranding extending in the perirenal adipose tissue, but the  previous abscess or fluid collection in this region appears to of  been effectively drained without a significant residual fluid  collection currently visible. No new abscess is observed.   Cellules are present along the  right margin of the urinary bladder.  The prostate gland indents the bladder base.   Stomach/Bowel: Unremarkable   Vascular/Lymphatic: Aortoiliac atherosclerotic vascular disease.  Small scattered left common iliac lymph nodes are present measuring  up to about 0.7 cm in short axis on image 50/2 (formerly the same).  A right common iliac node measures 0.9 cm in short axis on image  52/2, previously 1.0 cm by my measurements.   Reproductive: Prostatomegaly. Prostate food shells noted.   Other: No supplemental non-categorized findings.   Musculoskeletal: Left groin hernia contains adipose tissue. The  sclerotic metastatic lesion in the L2 vertebral body measures 2.7 cm  anterior-posterior, stable by my measurement.   IMPRESSION:  1. The previous abscess or fluid collection in the left retrorenal  space and tracking along the posterior renal fascia has been  effectively drained. There is some residual bandlike thickening  along the posterior renal fascia, but no significant residual fluid  collection currently visible. Pigtail catheter remains in place.  2. Left kidney upper pole 0.9 cm renal calculus.  3. Stable appearance of the sclerotic metastatic  lesion in the L2  vertebral body.  4. Continued left hydronephrosis and hydroureter despite the  presence of the well positioned double-J ureteral stent.  5. Stable mild right hydronephrosis and proximal hydroureter,  terminating in the mid ureter without obvious cause, query mild mid  ureteral stricture.  6. Other imaging findings of potential clinical significance:  Prostatomegaly. Left groin hernia contains adipose tissue. Stable  small pulmonary nodules at the lung bases. Stable upper normal sized  common iliac lymph nodes.   Aortic Atherosclerosis (ICD10-I70.0).    Electronically Signed  By: Van Clines M.D.  On: 03/06/2019 15:11    PROCEDURES:         C.T. ABD-Pelv w/o [id number]      Patient confirmed No Neulasta  OnPro Device.           Urinalysis w/Scope Dipstick Dipstick Cont'd Micro  Color: Yellow Bilirubin: Neg mg/dL WBC/hpf: 6 - 10/hpf  Appearance: Clear Ketones: Neg mg/dL RBC/hpf: 0 - 2/hpf  Specific Gravity: 1.015 Blood: Neg ery/uL Bacteria: Rare (0-9/hpf)  pH: 6.0 Protein: Trace mg/dL Cystals: NS (Not Seen)  Glucose: Neg mg/dL Urobilinogen: 0.2 mg/dL Casts: NS (Not Seen)    Nitrites: Neg Trichomonas: Not Present    Leukocyte Esterase: Trace leu/uL Mucous: Present      Epithelial Cells: 0 - 5/hpf      Yeast: NS (Not Seen)      Sperm: Not Present         Eligard 22.5mg / 3 Month [id number], [id number] The abdomen was sterilely prepped with alcohol. Eligard was injected subcutaneously ([place]) using standard technique. The patient tolerated the procedure well. A band aid was applied (not sure if this is necessary for a [place] injection). The site was dry when the patient left the exam room. The patient will return as scheduled.      Qty: 22.5 Adm. By: Sidonie Dickens  Unit: mg Lot No [id number]  Route: SQ Exp. Date 10/20/2020  Freq: Q3M Mfgr.:   Site: LLQ        Notes:   A bulb suction was removed from his drain and this was removed after the stitch at the skin was cut. This was removed without difficulty or complications.   ASSESSMENT:      ICD-10 Details  1 GU:   Prostate Cancer - C61   2   Ureteral obstruction - N13.1   3   Renal calculus - N20.0   4   Renal and perinephric abscess - N15.1    PLAN:           Orders Labs PSA, BMP, Total Testosterone, Urine Culture          Schedule Labs: 3 Months - PSA    3 Months - Total Testosterone    3 Months - BMP    3 Months - Urinalysis  Return Visit/Planned Activity: 3 Months - Office Visit, Eligard          Document Letter(s):  Created for Patient: Clinical Summary         Notes:   1. Perinephric abscess: This is resolved based on his imaging. His drain was removed today.   2. Urolithiasis/left renal calculus: We will not plan to further  address his left renal calculus at this time unless he becomes symptomatic considering his advanced age.   3. Metastatic prostate cancer: He will continue androgen deprivation and has received Eligard 22.5 mg today. He will plan to follow up in 3 months. His PSA will be checked  today and again in 3 months. We will then discuss longer intervals of Eligard depending on his other medical issues.   4. Possible left ureteral stricture/obstruction: I have recommended that we leave his indwelling stent at this time. His urine will be cultured. I recommended that he be scheduled for cystoscopy with left retrograde pyelography and or ureteroscopy for further evaluation. We will then determine whether he is stent can be removed with follow-up imaging or if he will need continued stent drainage with further discussion regarding further management options for ureteral obstruction. His renal function will be checked today and again in 3 months.   Cc: Dr. Lang Snow        Next Appointment:      Next Appointment: 06/19/2019 08:30 AM    Appointment Type: Laboratory Appointment    Location: [facility] [phone]    Provider: Lab LAB    Reason for Visit: 3 mnths PSA, BMP,total testo-Latisia Hilaire      * Signed by Raynelle Bring, M.D. on 03/06/19 at 7:50 PM (EST)*

## 2019-04-02 ENCOUNTER — Ambulatory Visit (HOSPITAL_BASED_OUTPATIENT_CLINIC_OR_DEPARTMENT_OTHER): Payer: Medicare Other | Admitting: Physician Assistant

## 2019-04-02 ENCOUNTER — Encounter (HOSPITAL_BASED_OUTPATIENT_CLINIC_OR_DEPARTMENT_OTHER): Payer: Self-pay | Admitting: Urology

## 2019-04-02 ENCOUNTER — Other Ambulatory Visit: Payer: Self-pay

## 2019-04-02 ENCOUNTER — Encounter (HOSPITAL_BASED_OUTPATIENT_CLINIC_OR_DEPARTMENT_OTHER)
Admission: RE | Disposition: A | Discharge status: Home / Self Care | Payer: Self-pay | Source: Home / Self Care | Attending: Urology

## 2019-04-02 ENCOUNTER — Ambulatory Visit (HOSPITAL_BASED_OUTPATIENT_CLINIC_OR_DEPARTMENT_OTHER)
Admission: RE | Admit: 2019-04-02 | Discharge: 2019-04-02 | Disposition: A | Discharge status: Home / Self Care | Payer: Medicare Other | Attending: Urology | Admitting: Urology

## 2019-04-02 DIAGNOSIS — H409 Unspecified glaucoma: Secondary | ICD-10-CM | POA: Insufficient documentation

## 2019-04-02 DIAGNOSIS — Z7982 Long term (current) use of aspirin: Secondary | ICD-10-CM | POA: Diagnosis not present

## 2019-04-02 DIAGNOSIS — I700 Atherosclerosis of aorta: Secondary | ICD-10-CM | POA: Diagnosis not present

## 2019-04-02 DIAGNOSIS — Z79899 Other long term (current) drug therapy: Secondary | ICD-10-CM | POA: Insufficient documentation

## 2019-04-02 DIAGNOSIS — E785 Hyperlipidemia, unspecified: Secondary | ICD-10-CM | POA: Insufficient documentation

## 2019-04-02 DIAGNOSIS — D649 Anemia, unspecified: Secondary | ICD-10-CM | POA: Diagnosis not present

## 2019-04-02 DIAGNOSIS — I10 Essential (primary) hypertension: Secondary | ICD-10-CM | POA: Insufficient documentation

## 2019-04-02 DIAGNOSIS — C61 Malignant neoplasm of prostate: Secondary | ICD-10-CM | POA: Insufficient documentation

## 2019-04-02 DIAGNOSIS — Z923 Personal history of irradiation: Secondary | ICD-10-CM | POA: Insufficient documentation

## 2019-04-02 DIAGNOSIS — R509 Fever, unspecified: Secondary | ICD-10-CM | POA: Diagnosis not present

## 2019-04-02 DIAGNOSIS — N131 Hydronephrosis with ureteral stricture, not elsewhere classified: Secondary | ICD-10-CM | POA: Insufficient documentation

## 2019-04-02 DIAGNOSIS — E7800 Pure hypercholesterolemia, unspecified: Secondary | ICD-10-CM | POA: Diagnosis not present

## 2019-04-02 DIAGNOSIS — C7951 Secondary malignant neoplasm of bone: Secondary | ICD-10-CM | POA: Insufficient documentation

## 2019-04-02 DIAGNOSIS — R918 Other nonspecific abnormal finding of lung field: Secondary | ICD-10-CM | POA: Diagnosis not present

## 2019-04-02 DIAGNOSIS — M1990 Unspecified osteoarthritis, unspecified site: Secondary | ICD-10-CM | POA: Insufficient documentation

## 2019-04-02 DIAGNOSIS — N400 Enlarged prostate without lower urinary tract symptoms: Secondary | ICD-10-CM | POA: Diagnosis not present

## 2019-04-02 DIAGNOSIS — N202 Calculus of kidney with calculus of ureter: Secondary | ICD-10-CM | POA: Diagnosis present

## 2019-04-02 DIAGNOSIS — Z466 Encounter for fitting and adjustment of urinary device: Secondary | ICD-10-CM | POA: Diagnosis not present

## 2019-04-02 DIAGNOSIS — N132 Hydronephrosis with renal and ureteral calculous obstruction: Secondary | ICD-10-CM | POA: Insufficient documentation

## 2019-04-02 DIAGNOSIS — Z801 Family history of malignant neoplasm of trachea, bronchus and lung: Secondary | ICD-10-CM | POA: Diagnosis not present

## 2019-04-02 DIAGNOSIS — N200 Calculus of kidney: Secondary | ICD-10-CM | POA: Diagnosis not present

## 2019-04-02 DIAGNOSIS — Z87891 Personal history of nicotine dependence: Secondary | ICD-10-CM | POA: Diagnosis not present

## 2019-04-02 HISTORY — DX: Personal history of urinary calculi: Z87.442

## 2019-04-02 HISTORY — PX: CYSTOSCOPY WITH URETEROSCOPY AND STENT PLACEMENT: SHX6377

## 2019-04-02 LAB — POCT I-STAT, CHEM 8
BUN: 24 mg/dL — ABNORMAL HIGH (ref 8–23)
Calcium, Ion: 1.29 mmol/L (ref 1.15–1.40)
Chloride: 106 mmol/L (ref 98–111)
Creatinine, Ser: 1.4 mg/dL — ABNORMAL HIGH (ref 0.61–1.24)
Glucose, Bld: 138 mg/dL — ABNORMAL HIGH (ref 70–99)
HCT: 33 % — ABNORMAL LOW (ref 39.0–52.0)
Hemoglobin: 11.2 g/dL — ABNORMAL LOW (ref 13.0–17.0)
Potassium: 4.5 mmol/L (ref 3.5–5.1)
Sodium: 139 mmol/L (ref 135–145)
TCO2: 26 mmol/L (ref 22–32)

## 2019-04-02 SURGERY — CYSTOURETEROSCOPY, WITH STENT INSERTION
Anesthesia: General | Site: Ureter | Laterality: Left

## 2019-04-02 MED ORDER — GLYCOPYRROLATE 0.2 MG/ML IJ SOLN
INTRAMUSCULAR | Status: DC | PRN
  Administered 2019-04-02: .2 mg via INTRAVENOUS

## 2019-04-02 MED ORDER — PHENYLEPHRINE 40 MCG/ML (10ML) SYRINGE FOR IV PUSH (FOR BLOOD PRESSURE SUPPORT)
PREFILLED_SYRINGE | INTRAVENOUS | Status: AC
  Filled 2019-04-02: qty 10

## 2019-04-02 MED ORDER — DEXAMETHASONE SODIUM PHOSPHATE 4 MG/ML IJ SOLN
INTRAMUSCULAR | Status: DC | PRN
  Administered 2019-04-02: 5 mg via INTRAVENOUS

## 2019-04-02 MED ORDER — FENTANYL CITRATE (PF) 100 MCG/2ML IJ SOLN
25.0000 ug | INTRAMUSCULAR | Status: DC | PRN
  Filled 2019-04-02: qty 1

## 2019-04-02 MED ORDER — ONDANSETRON HCL 4 MG/2ML IJ SOLN
INTRAMUSCULAR | Status: DC | PRN
  Administered 2019-04-02: 4 mg via INTRAVENOUS

## 2019-04-02 MED ORDER — GLYCOPYRROLATE PF 0.2 MG/ML IJ SOSY
PREFILLED_SYRINGE | INTRAMUSCULAR | Status: AC
  Filled 2019-04-02: qty 1

## 2019-04-02 MED ORDER — FENTANYL CITRATE (PF) 100 MCG/2ML IJ SOLN
INTRAMUSCULAR | Status: AC
  Filled 2019-04-02: qty 2

## 2019-04-02 MED ORDER — ONDANSETRON HCL 4 MG/2ML IJ SOLN
INTRAMUSCULAR | Status: AC
  Filled 2019-04-02: qty 2

## 2019-04-02 MED ORDER — GLYCOPYRROLATE 0.2 MG/ML IJ SOLN: INTRAMUSCULAR | Status: DC | PRN

## 2019-04-02 MED ORDER — EPHEDRINE 5 MG/ML INJ
INTRAVENOUS | Status: AC
  Filled 2019-04-02: qty 10

## 2019-04-02 MED ORDER — DEXAMETHASONE SODIUM PHOSPHATE 10 MG/ML IJ SOLN
INTRAMUSCULAR | Status: AC
  Filled 2019-04-02: qty 1

## 2019-04-02 MED ORDER — ACETAMINOPHEN 500 MG PO TABS
ORAL_TABLET | ORAL | Status: AC
  Filled 2019-04-02: qty 2

## 2019-04-02 MED ORDER — LIDOCAINE HCL (CARDIAC) PF 100 MG/5ML IV SOSY
PREFILLED_SYRINGE | INTRAVENOUS | Status: DC | PRN
  Administered 2019-04-02: 60 mg via INTRAVENOUS

## 2019-04-02 MED ORDER — FENTANYL CITRATE (PF) 100 MCG/2ML IJ SOLN
INTRAMUSCULAR | Status: DC | PRN
  Administered 2019-04-02 (×4): 25 ug via INTRAVENOUS

## 2019-04-02 MED ORDER — PROPOFOL 10 MG/ML IV BOLUS
INTRAVENOUS | Status: DC | PRN
  Administered 2019-04-02 (×2): 100 mg via INTRAVENOUS

## 2019-04-02 MED ORDER — PROPOFOL 10 MG/ML IV BOLUS
INTRAVENOUS | Status: AC
  Filled 2019-04-02: qty 40

## 2019-04-02 MED ORDER — CEFAZOLIN SODIUM-DEXTROSE 2-4 GM/100ML-% IV SOLN
INTRAVENOUS | Status: AC
  Filled 2019-04-02: qty 100

## 2019-04-02 MED ORDER — PHENYLEPHRINE HCL (PRESSORS) 10 MG/ML IV SOLN
INTRAVENOUS | Status: DC | PRN
  Administered 2019-04-02: 40 ug via INTRAVENOUS
  Administered 2019-04-02 (×3): 80 ug via INTRAVENOUS

## 2019-04-02 MED ORDER — EPHEDRINE SULFATE 50 MG/ML IJ SOLN
INTRAMUSCULAR | Status: DC | PRN
  Administered 2019-04-02 (×2): 10 mg via INTRAVENOUS

## 2019-04-02 MED ORDER — LIDOCAINE 2% (20 MG/ML) 5 ML SYRINGE
INTRAMUSCULAR | Status: AC
  Filled 2019-04-02: qty 5

## 2019-04-02 MED ORDER — SODIUM CHLORIDE 0.9 % IR SOLN
Status: DC | PRN
  Administered 2019-04-02: 6000 mL via INTRAVESICAL

## 2019-04-02 MED ORDER — IOHEXOL 300 MG/ML  SOLN
INTRAMUSCULAR | Status: DC | PRN
  Administered 2019-04-02: 10 mL

## 2019-04-02 MED ORDER — CEFAZOLIN SODIUM-DEXTROSE 2-4 GM/100ML-% IV SOLN
2.0000 g | Freq: Once | INTRAVENOUS | Status: AC
  Administered 2019-04-02: 14:00:00 2 g via INTRAVENOUS
  Filled 2019-04-02: qty 100

## 2019-04-02 MED ORDER — ACETAMINOPHEN 500 MG PO TABS
1000.0000 mg | ORAL_TABLET | Freq: Once | ORAL | Status: AC
  Administered 2019-04-02: 1000 mg via ORAL
  Filled 2019-04-02: qty 2

## 2019-04-02 MED ORDER — SODIUM CHLORIDE 0.9 % IV SOLN
INTRAVENOUS | Status: DC
  Filled 2019-04-02: qty 1000

## 2019-04-02 SURGICAL SUPPLY — 21 items
BAG URO CATCHER STRL LF (MISCELLANEOUS) ×3 IMPLANT
BASKET ZERO TIP NITINOL 2.4FR (BASKET) IMPLANT
BSKT STON RTRVL ZERO TP 2.4FR (BASKET)
CATH INTERMIT  6FR 70CM (CATHETERS) ×2 IMPLANT
CLOTH BEACON ORANGE TIMEOUT ST (SAFETY) ×3 IMPLANT
FIBER LASER FLEXIVA 365 (UROLOGICAL SUPPLIES) IMPLANT
FIBER LASER TRAC TIP (UROLOGICAL SUPPLIES) IMPLANT
GLOVE BIOGEL M STRL SZ7.5 (GLOVE) ×3 IMPLANT
GOWN STRL REUS W/TWL LRG LVL3 (GOWN DISPOSABLE) ×6 IMPLANT
GUIDEWIRE ANG ZIPWIRE 038X150 (WIRE) IMPLANT
GUIDEWIRE STR DUAL SENSOR (WIRE) ×5 IMPLANT
IV NS 1000ML (IV SOLUTION)
IV NS 1000ML BAXH (IV SOLUTION) ×1 IMPLANT
IV NS IRRIG 3000ML ARTHROMATIC (IV SOLUTION) ×5 IMPLANT
KIT TURNOVER CYSTO (KITS) ×3 IMPLANT
MANIFOLD NEPTUNE II (INSTRUMENTS) ×3 IMPLANT
PACK CYSTO (CUSTOM PROCEDURE TRAY) ×3 IMPLANT
SHEATH URETERAL 12FRX35CM (MISCELLANEOUS) IMPLANT
TUBE CONNECTING 12'X1/4 (SUCTIONS) ×1
TUBE CONNECTING 12X1/4 (SUCTIONS) ×2 IMPLANT
TUBING UROLOGY SET (TUBING) ×3 IMPLANT

## 2019-04-02 NOTE — Discharge Instructions (Signed)
1. You may see some blood in the urine and may have some burning with urination for 48-72 hours. You also may notice that you have to urinate more frequently or urgently after your procedure which is normal.  2. You should call should you develop an inability urinate, fever > 101, persistent nausea and vomiting that prevents you from eating or drinking to stay hydrated.   CYSTOSCOPY HOME CARE INSTRUCTIONS  Activity: Rest for the remainder of the day.  Do not drive or operate equipment today.  You may resume normal activities in one to two days as instructed by your physician.   Meals: Drink plenty of liquids and eat light foods such as gelatin or soup this evening.  You may return to a normal meal plan tomorrow.  Return to Work: You may return to work in one to two days or as instructed by your physician.  Special Instructions / Symptoms: Call your physician if any of these symptoms occur:   -persistent or heavy bleeding  -bleeding which continues after first few urination  -large blood clots that are difficult to pass  -urine stream diminishes or stops completely  -fever equal to or higher than 101 degrees Farenheit.  -cloudy urine with a strong, foul odor  -severe pain  Females should always wipe from front to back after elimination.  You may feel some burning pain when you urinate.  This should disappear with time.  Applying moist heat to the lower abdomen or a hot tub bath may help relieve the pain. \  Call for an appointment to arrange follow-up.   Post Anesthesia Home Care Instructions  Activity: Get plenty of rest for the remainder of the day. A responsible adult should stay with you for 24 hours following the procedure.  For the next 24 hours, DO NOT: -Drive a car -Paediatric nurse -Drink alcoholic beverages -Take any medication unless instructed by your physician -Make any legal decisions or sign important papers.  Meals: Start with liquid foods such as gelatin or  soup. Progress to regular foods as tolerated. Avoid greasy, spicy, heavy foods. If nausea and/or vomiting occur, drink only clear liquids until the nausea and/or vomiting subsides. Call your physician if vomiting continues.  Special Instructions/Symptoms: Your throat may feel dry or sore from the anesthesia or the breathing tube placed in your throat during surgery. If this causes discomfort, gargle with warm salt water. The discomfort should disappear within 24 hours.  If you had a scopolamine patch placed behind your ear for the management of post- operative nausea and/or vomiting:  1. The medication in the patch is effective for 72 hours, after which it should be removed.  Wrap patch in a tissue and discard in the trash. Wash hands thoroughly with soap and water. 2. You may remove the patch earlier than 72 hours if you experience unpleasant side effects which may include dry mouth, dizziness or visual disturbances. 3. Avoid touching the patch. Wash your hands with soap and water after contact with the patch.

## 2019-04-02 NOTE — Op Note (Signed)
Preoperative diagnosis: Left ureteral obstruction, left renal calculus  Postoperative diagnosis: History of left ureteral obstruction, left renal calculus  Procedure:  1. Cystoscopy 2. Removal of left ureteral stent 3. Left ureteroscopy 4. Left retrograde pyelography with interpretation  Surgeon: Pryor Curia. M.D.  Anesthesia: General  Complications: None  Intraoperative findings: Left retrograde pyelography demonstrated a very tortuous left ureter but without clear evidence of obstruction intrinsically.  EBL: Minimal  Specimens: None.  Disposition of specimens: [facility] for stone analysis  Indication: Levi Cervantes is a 86 y.o. year old patient with urolithiasis who recently had a left ureteral stone.  He underwent attempted retrograde ureteroscopic treatment at an outside institution and two attempts were unsuccessful.  It was felt that he may have a distal left ureteral stricture.  He then underwent an antegrade percutaneous procedure to remove his left ureteral stone.  His stone was removed and a stent was placed.  After reviewing the management options for treatment, the patient elected to proceed with the above surgical procedure(s). We have discussed the potential benefits and risks of the procedure, side effects of the proposed treatment, the likelihood of the patient achieving the goals of the procedure, and any potential problems that might occur during the procedure or recuperation. Informed consent has been obtained.  Description of procedure:  The patient was taken to the operating room and general anesthesia was induced.  The patient was placed in the dorsal lithotomy position, prepped and draped in the usual sterile fashion, and preoperative antibiotics were administered. A preoperative time-out was performed.   Cystourethroscopy was performed.  The patient's urethra was examined and there was a bulbar stricture consistent with his history.   This was able to be bypassed with cystoscopy. The bladder was then systematically examined in its entirety. There was no evidence for any bladder tumors, stones, or other mucosal pathology.  There was an indwelling ureteral stent.  The prostate was enlarged with a large intravesical median lobe and very high bladder neck.  The indwelling stent was removed with flexible graspers.  A 0.38 sensor guidewire was inserted up into the left renal collecting system under fluoroscopic imaging. The wire was backloaded on the cystoscope and a 5 Fr ureteral catheter was advanced over the wire into the distal ureter.  The wire was removed. Omnipaque contrast was injected through the ureteral catheter and a retrograde pyelogram was performed with findings as dictated above.  There was a very tortuous ureter but no clear intrinsic obstruction noted.  I replaced the guidewire and inserted a 6 Fr semirigid ureteroscope into the distal ureter.  There was evidence of some mild blanching of the distal left ureter but the scope was able to be passed without difficulty.  The ureteroscope was advanced up the entire ureter to the UPJ with a 2nd guidewire guiding the scope through the tortuous ureter.  No stones or other intrinsic abnormalities were noted.  It was felt that there was no clear evidence of obstruction.  Therefore, it was decided to remove the wire and leave him without a stent.  The bladder was then emptied and the procedure ended.  The patient appeared to tolerate the procedure well and without complications.  The patient was able to be awakened and transferred to the recovery unit in satisfactory condition.

## 2019-04-02 NOTE — Anesthesia Preprocedure Evaluation (Addendum)
Anesthesia Evaluation  Patient identified by MRN, date of birth, ID band Patient awake    Reviewed: Allergy & Precautions, NPO status , Patient's Chart, lab work & pertinent test results  Airway Mallampati: II  TM Distance: >3 FB Neck ROM: Full    Dental no notable dental hx. (+) Teeth Intact, Dental Advisory Given   Pulmonary neg pulmonary ROS, former smoker,    Pulmonary exam normal breath sounds clear to auscultation       Cardiovascular hypertension, Normal cardiovascular exam Rhythm:Regular Rate:Normal  HLD   Neuro/Psych negative neurological ROS  negative psych ROS   GI/Hepatic negative GI ROS, Neg liver ROS,   Endo/Other  negative endocrine ROS  Renal/GU Renal InsufficiencyRenal disease  negative genitourinary   Musculoskeletal  (+) Arthritis ,   Abdominal   Peds  Hematology  (+) Blood dyscrasia, anemia ,   Anesthesia Other Findings   Reproductive/Obstetrics                            Anesthesia Physical Anesthesia Plan  ASA: III  Anesthesia Plan: General   Post-op Pain Management:    Induction: Intravenous  PONV Risk Score and Plan: 2 and Ondansetron and Dexamethasone  Airway Management Planned: LMA  Additional Equipment:   Intra-op Plan:   Post-operative Plan: Extubation in OR  Informed Consent: I have reviewed the patients History and Physical, chart, labs and discussed the procedure including the risks, benefits and alternatives for the proposed anesthesia with the patient or authorized representative who has indicated his/her understanding and acceptance.     Dental advisory given  Plan Discussed with: CRNA  Anesthesia Plan Comments:         Anesthesia Quick Evaluation

## 2019-04-02 NOTE — Anesthesia Procedure Notes (Signed)
Procedure Name: LMA Insertion Date/Time: 04/02/2019 1:40 PM Performed by: Justice Rocher, CRNA Pre-anesthesia Checklist: Patient identified, Emergency Drugs available, Suction available and Patient being monitored Patient Re-evaluated:Patient Re-evaluated prior to induction Oxygen Delivery Method: Circle system utilized Preoxygenation: Pre-oxygenation with 100% oxygen Induction Type: IV induction Ventilation: Mask ventilation without difficulty LMA: LMA inserted LMA Size: 5.0 Number of attempts: 1 Airway Equipment and Method: Bite block Placement Confirmation: positive ETCO2,  CO2 detector and breath sounds checked- equal and bilateral Tube secured with: Tape Dental Injury: Teeth and Oropharynx as per pre-operative assessment

## 2019-04-02 NOTE — Interval H&P Note (Signed)
History and Physical Interval Note:  04/02/2019 1:25 PM  Yvette Rack  has presented today for surgery, with the diagnosis of LEFT URETERAL OBSTRUCTION.  The various methods of treatment have been discussed with the patient and family. After consideration of risks, benefits and other options for treatment, the patient has consented to  Procedure(s): CYSTOSCOPY WITH URETEROSCOPY AND STENT REPLACEMENT (Left) as a surgical intervention.  The patient's history has been reviewed, patient examined, no change in status, stable for surgery.  I have reviewed the patient's chart and labs.  Questions were answered to the patient's satisfaction.     [employer]

## 2019-04-02 NOTE — Transfer of Care (Signed)
Immediate Anesthesia Transfer of Care Note  Patient: Levi Cervantes  Procedure(s) Performed: Procedure(s) (LRB): CYSTOSCOPY WITH URETEROSCOPY AND STENT REPLACEMENT (Left)  Patient Location: PACU  Anesthesia Type: General  Level of Consciousness: awake, sedated, patient cooperative and responds to stimulation  Airway & Oxygen Therapy: Patient Spontanous Breathing and Patient connected to [place] 02 and soft FM   Post-op Assessment: Report given to PACU RN, Post -op Vital signs reviewed and stable and Patient moving all extremities  Post vital signs: Reviewed and stable  Complications: No apparent anesthesia complications

## 2019-04-03 ENCOUNTER — Other Ambulatory Visit (HOSPITAL_COMMUNITY): Payer: Self-pay | Admitting: Urology

## 2019-04-03 ENCOUNTER — Other Ambulatory Visit: Payer: Self-pay | Admitting: Urology

## 2019-04-03 DIAGNOSIS — N131 Hydronephrosis with ureteral stricture, not elsewhere classified: Secondary | ICD-10-CM

## 2019-04-03 NOTE — Anesthesia Postprocedure Evaluation (Signed)
Anesthesia Post Note  Patient: Levi Cervantes  Procedure(s) Performed: CYSTOSCOPY WITH URETEROSCOPY AND STENT REPLACEMENT (Left Ureter)     Patient location during evaluation: PACU Anesthesia Type: General Level of consciousness: awake and alert Pain management: pain level controlled Vital Signs Assessment: post-procedure vital signs reviewed and stable Respiratory status: spontaneous breathing, nonlabored ventilation, respiratory function stable and patient connected to nasal cannula oxygen Cardiovascular status: blood pressure returned to baseline and stable Postop Assessment: no apparent nausea or vomiting Anesthetic complications: no    Last Vitals:  Vitals:   04/02/19 1445 04/02/19 1530  BP: 133/72 (!) 143/80  Pulse: 85 71  Resp: 12 14  Temp:  (!) 36.3 C  SpO2: 95% 96%    Last Pain:  Vitals:   04/02/19 1715  TempSrc:   PainSc: 0-No pain   Pain Goal: Patients Stated Pain Goal: 5 (04/02/19 1210)                 Chelsey L Woodrum

## 2019-04-08 DIAGNOSIS — Z87442 Personal history of urinary calculi: Secondary | ICD-10-CM | POA: Diagnosis not present

## 2019-04-08 DIAGNOSIS — E785 Hyperlipidemia, unspecified: Secondary | ICD-10-CM | POA: Diagnosis not present

## 2019-04-08 DIAGNOSIS — N1832 Chronic kidney disease, stage 3b: Secondary | ICD-10-CM | POA: Diagnosis not present

## 2019-04-08 DIAGNOSIS — C7951 Secondary malignant neoplasm of bone: Secondary | ICD-10-CM | POA: Diagnosis not present

## 2019-04-08 DIAGNOSIS — I129 Hypertensive chronic kidney disease with stage 1 through stage 4 chronic kidney disease, or unspecified chronic kidney disease: Secondary | ICD-10-CM | POA: Diagnosis not present

## 2019-04-08 DIAGNOSIS — C61 Malignant neoplasm of prostate: Secondary | ICD-10-CM | POA: Diagnosis not present

## 2019-04-08 DIAGNOSIS — E1129 Type 2 diabetes mellitus with other diabetic kidney complication: Secondary | ICD-10-CM | POA: Diagnosis not present

## 2019-04-14 DIAGNOSIS — R3914 Feeling of incomplete bladder emptying: Secondary | ICD-10-CM | POA: Diagnosis not present

## 2019-04-14 DIAGNOSIS — R300 Dysuria: Secondary | ICD-10-CM | POA: Diagnosis not present

## 2019-04-23 ENCOUNTER — Other Ambulatory Visit: Payer: Self-pay

## 2019-04-23 ENCOUNTER — Ambulatory Visit (HOSPITAL_COMMUNITY)
Admission: RE | Admit: 2019-04-23 | Discharge: 2019-04-23 | Disposition: A | Discharge status: Home / Self Care | Payer: Medicare Other | Source: Ambulatory Visit | Attending: Urology | Admitting: Urology

## 2019-04-23 DIAGNOSIS — N131 Hydronephrosis with ureteral stricture, not elsewhere classified: Secondary | ICD-10-CM | POA: Insufficient documentation

## 2019-04-23 MED ORDER — FUROSEMIDE 10 MG/ML IJ SOLN
INTRAMUSCULAR | Status: AC
  Filled 2019-04-23: qty 8

## 2019-04-23 MED ORDER — TECHNETIUM TC 99M MERTIATIDE
5.1000 | Freq: Once | INTRAVENOUS | Status: AC | PRN
  Administered 2019-04-23: 5.1 via INTRAVENOUS

## 2019-04-23 MED ORDER — FUROSEMIDE 10 MG/ML IJ SOLN
47.0000 mg | Freq: Once | INTRAMUSCULAR | Status: AC
  Administered 2019-04-23: 45 mg via INTRAVENOUS

## 2019-04-27 DIAGNOSIS — H25813 Combined forms of age-related cataract, bilateral: Secondary | ICD-10-CM | POA: Diagnosis not present

## 2019-04-27 DIAGNOSIS — H401131 Primary open-angle glaucoma, bilateral, mild stage: Secondary | ICD-10-CM | POA: Diagnosis not present

## 2019-04-27 DIAGNOSIS — H40243 Residual stage of angle-closure glaucoma, bilateral: Secondary | ICD-10-CM | POA: Diagnosis not present

## 2019-04-29 DIAGNOSIS — E1129 Type 2 diabetes mellitus with other diabetic kidney complication: Secondary | ICD-10-CM | POA: Diagnosis not present

## 2019-05-27 DIAGNOSIS — H16103 Unspecified superficial keratitis, bilateral: Secondary | ICD-10-CM | POA: Diagnosis not present

## 2019-05-27 DIAGNOSIS — H25813 Combined forms of age-related cataract, bilateral: Secondary | ICD-10-CM | POA: Diagnosis not present

## 2019-05-27 DIAGNOSIS — H04123 Dry eye syndrome of bilateral lacrimal glands: Secondary | ICD-10-CM | POA: Diagnosis not present

## 2019-05-27 DIAGNOSIS — H401131 Primary open-angle glaucoma, bilateral, mild stage: Secondary | ICD-10-CM | POA: Diagnosis not present

## 2019-06-19 DIAGNOSIS — C61 Malignant neoplasm of prostate: Secondary | ICD-10-CM | POA: Diagnosis not present

## 2019-06-26 DIAGNOSIS — C61 Malignant neoplasm of prostate: Secondary | ICD-10-CM | POA: Diagnosis not present

## 2019-06-26 DIAGNOSIS — C7951 Secondary malignant neoplasm of bone: Secondary | ICD-10-CM | POA: Diagnosis not present

## 2019-06-26 DIAGNOSIS — R3912 Poor urinary stream: Secondary | ICD-10-CM | POA: Diagnosis not present

## 2019-06-26 DIAGNOSIS — Z5111 Encounter for antineoplastic chemotherapy: Secondary | ICD-10-CM | POA: Diagnosis not present

## 2019-09-19 DIAGNOSIS — C61 Malignant neoplasm of prostate: Secondary | ICD-10-CM | POA: Diagnosis not present

## 2019-09-23 DIAGNOSIS — R3912 Poor urinary stream: Secondary | ICD-10-CM | POA: Diagnosis not present

## 2019-09-23 DIAGNOSIS — C61 Malignant neoplasm of prostate: Secondary | ICD-10-CM | POA: Diagnosis not present

## 2019-09-23 DIAGNOSIS — C7951 Secondary malignant neoplasm of bone: Secondary | ICD-10-CM | POA: Diagnosis not present

## 2019-09-23 DIAGNOSIS — N401 Enlarged prostate with lower urinary tract symptoms: Secondary | ICD-10-CM | POA: Diagnosis not present

## 2019-09-30 DIAGNOSIS — Z125 Encounter for screening for malignant neoplasm of prostate: Secondary | ICD-10-CM | POA: Diagnosis not present

## 2019-09-30 DIAGNOSIS — E7849 Other hyperlipidemia: Secondary | ICD-10-CM | POA: Diagnosis not present

## 2019-09-30 DIAGNOSIS — E1129 Type 2 diabetes mellitus with other diabetic kidney complication: Secondary | ICD-10-CM | POA: Diagnosis not present

## 2019-10-05 DIAGNOSIS — H40243 Residual stage of angle-closure glaucoma, bilateral: Secondary | ICD-10-CM | POA: Diagnosis not present

## 2019-10-05 DIAGNOSIS — H25813 Combined forms of age-related cataract, bilateral: Secondary | ICD-10-CM | POA: Diagnosis not present

## 2019-10-05 DIAGNOSIS — H401131 Primary open-angle glaucoma, bilateral, mild stage: Secondary | ICD-10-CM | POA: Diagnosis not present

## 2019-10-07 DIAGNOSIS — E785 Hyperlipidemia, unspecified: Secondary | ICD-10-CM | POA: Diagnosis not present

## 2019-10-07 DIAGNOSIS — I129 Hypertensive chronic kidney disease with stage 1 through stage 4 chronic kidney disease, or unspecified chronic kidney disease: Secondary | ICD-10-CM | POA: Diagnosis not present

## 2019-10-07 DIAGNOSIS — I10 Essential (primary) hypertension: Secondary | ICD-10-CM | POA: Diagnosis not present

## 2019-10-07 DIAGNOSIS — Z0000 Encounter for general adult medical examination without abnormal findings: Secondary | ICD-10-CM | POA: Diagnosis not present

## 2019-10-07 DIAGNOSIS — E669 Obesity, unspecified: Secondary | ICD-10-CM | POA: Diagnosis not present

## 2019-10-07 DIAGNOSIS — C61 Malignant neoplasm of prostate: Secondary | ICD-10-CM | POA: Diagnosis not present

## 2019-10-07 DIAGNOSIS — R82998 Other abnormal findings in urine: Secondary | ICD-10-CM | POA: Diagnosis not present

## 2019-10-07 DIAGNOSIS — N1832 Chronic kidney disease, stage 3b: Secondary | ICD-10-CM | POA: Diagnosis not present

## 2019-10-07 DIAGNOSIS — D649 Anemia, unspecified: Secondary | ICD-10-CM | POA: Diagnosis not present

## 2019-10-07 DIAGNOSIS — C7951 Secondary malignant neoplasm of bone: Secondary | ICD-10-CM | POA: Diagnosis not present

## 2019-10-07 DIAGNOSIS — C775 Secondary and unspecified malignant neoplasm of intrapelvic lymph nodes: Secondary | ICD-10-CM | POA: Diagnosis not present

## 2019-10-07 DIAGNOSIS — M19041 Primary osteoarthritis, right hand: Secondary | ICD-10-CM | POA: Diagnosis not present

## 2019-10-07 DIAGNOSIS — E1129 Type 2 diabetes mellitus with other diabetic kidney complication: Secondary | ICD-10-CM | POA: Diagnosis not present

## 2019-10-07 DIAGNOSIS — M25431 Effusion, right wrist: Secondary | ICD-10-CM | POA: Diagnosis not present

## 2019-11-17 DIAGNOSIS — Z23 Encounter for immunization: Secondary | ICD-10-CM | POA: Diagnosis not present

## 2020-01-01 DIAGNOSIS — C61 Malignant neoplasm of prostate: Secondary | ICD-10-CM | POA: Diagnosis not present

## 2020-01-08 DIAGNOSIS — Z5111 Encounter for antineoplastic chemotherapy: Secondary | ICD-10-CM | POA: Diagnosis not present

## 2020-01-08 DIAGNOSIS — C61 Malignant neoplasm of prostate: Secondary | ICD-10-CM | POA: Diagnosis not present

## 2020-01-08 DIAGNOSIS — N401 Enlarged prostate with lower urinary tract symptoms: Secondary | ICD-10-CM | POA: Diagnosis not present

## 2020-01-08 DIAGNOSIS — C7951 Secondary malignant neoplasm of bone: Secondary | ICD-10-CM | POA: Diagnosis not present

## 2020-01-08 DIAGNOSIS — R3912 Poor urinary stream: Secondary | ICD-10-CM | POA: Diagnosis not present

## 2020-01-08 DIAGNOSIS — Z87442 Personal history of urinary calculi: Secondary | ICD-10-CM | POA: Diagnosis not present

## 2020-01-20 DIAGNOSIS — M79641 Pain in right hand: Secondary | ICD-10-CM | POA: Diagnosis not present

## 2020-01-20 DIAGNOSIS — M65831 Other synovitis and tenosynovitis, right forearm: Secondary | ICD-10-CM | POA: Diagnosis not present

## 2020-01-20 DIAGNOSIS — M19031 Primary osteoarthritis, right wrist: Secondary | ICD-10-CM | POA: Diagnosis not present

## 2020-02-24 DIAGNOSIS — M79641 Pain in right hand: Secondary | ICD-10-CM | POA: Diagnosis not present

## 2020-02-24 DIAGNOSIS — M13831 Other specified arthritis, right wrist: Secondary | ICD-10-CM | POA: Diagnosis not present

## 2020-02-24 DIAGNOSIS — M65831 Other synovitis and tenosynovitis, right forearm: Secondary | ICD-10-CM | POA: Diagnosis not present

## 2020-03-23 DIAGNOSIS — M65831 Other synovitis and tenosynovitis, right forearm: Secondary | ICD-10-CM | POA: Diagnosis not present

## 2020-03-23 DIAGNOSIS — M13831 Other specified arthritis, right wrist: Secondary | ICD-10-CM | POA: Diagnosis not present

## 2020-03-23 DIAGNOSIS — M79641 Pain in right hand: Secondary | ICD-10-CM | POA: Diagnosis not present

## 2020-04-12 DIAGNOSIS — E669 Obesity, unspecified: Secondary | ICD-10-CM | POA: Diagnosis not present

## 2020-04-12 DIAGNOSIS — E785 Hyperlipidemia, unspecified: Secondary | ICD-10-CM | POA: Diagnosis not present

## 2020-04-12 DIAGNOSIS — I129 Hypertensive chronic kidney disease with stage 1 through stage 4 chronic kidney disease, or unspecified chronic kidney disease: Secondary | ICD-10-CM | POA: Diagnosis not present

## 2020-04-12 DIAGNOSIS — E1129 Type 2 diabetes mellitus with other diabetic kidney complication: Secondary | ICD-10-CM | POA: Diagnosis not present

## 2020-04-12 DIAGNOSIS — M19041 Primary osteoarthritis, right hand: Secondary | ICD-10-CM | POA: Diagnosis not present

## 2020-04-12 DIAGNOSIS — C775 Secondary and unspecified malignant neoplasm of intrapelvic lymph nodes: Secondary | ICD-10-CM | POA: Diagnosis not present

## 2020-04-12 DIAGNOSIS — N1832 Chronic kidney disease, stage 3b: Secondary | ICD-10-CM | POA: Diagnosis not present

## 2020-04-12 DIAGNOSIS — C7951 Secondary malignant neoplasm of bone: Secondary | ICD-10-CM | POA: Diagnosis not present

## 2020-04-12 DIAGNOSIS — D649 Anemia, unspecified: Secondary | ICD-10-CM | POA: Diagnosis not present

## 2020-04-12 DIAGNOSIS — C61 Malignant neoplasm of prostate: Secondary | ICD-10-CM | POA: Diagnosis not present

## 2020-04-12 DIAGNOSIS — Z86010 Personal history of colonic polyps: Secondary | ICD-10-CM | POA: Diagnosis not present

## 2020-04-21 DIAGNOSIS — M65831 Other synovitis and tenosynovitis, right forearm: Secondary | ICD-10-CM | POA: Diagnosis not present

## 2020-04-21 DIAGNOSIS — M79641 Pain in right hand: Secondary | ICD-10-CM | POA: Diagnosis not present

## 2020-04-21 DIAGNOSIS — M13831 Other specified arthritis, right wrist: Secondary | ICD-10-CM | POA: Diagnosis not present

## 2020-05-27 DIAGNOSIS — C7951 Secondary malignant neoplasm of bone: Secondary | ICD-10-CM | POA: Diagnosis not present

## 2020-05-27 DIAGNOSIS — M545 Low back pain, unspecified: Secondary | ICD-10-CM | POA: Diagnosis not present

## 2020-05-27 DIAGNOSIS — C775 Secondary and unspecified malignant neoplasm of intrapelvic lymph nodes: Secondary | ICD-10-CM | POA: Diagnosis not present

## 2020-05-27 DIAGNOSIS — C61 Malignant neoplasm of prostate: Secondary | ICD-10-CM | POA: Diagnosis not present

## 2020-06-13 DIAGNOSIS — M545 Low back pain, unspecified: Secondary | ICD-10-CM | POA: Diagnosis not present

## 2020-06-20 DIAGNOSIS — M545 Low back pain, unspecified: Secondary | ICD-10-CM | POA: Diagnosis not present

## 2020-06-27 DIAGNOSIS — M545 Low back pain, unspecified: Secondary | ICD-10-CM | POA: Diagnosis not present

## 2020-07-04 DIAGNOSIS — M545 Low back pain, unspecified: Secondary | ICD-10-CM | POA: Diagnosis not present

## 2020-07-11 DIAGNOSIS — M545 Low back pain, unspecified: Secondary | ICD-10-CM | POA: Diagnosis not present

## 2020-07-27 DIAGNOSIS — M545 Low back pain, unspecified: Secondary | ICD-10-CM | POA: Diagnosis not present

## 2020-08-10 DIAGNOSIS — C61 Malignant neoplasm of prostate: Secondary | ICD-10-CM | POA: Diagnosis not present

## 2020-08-17 DIAGNOSIS — R3912 Poor urinary stream: Secondary | ICD-10-CM | POA: Diagnosis not present

## 2020-08-17 DIAGNOSIS — N401 Enlarged prostate with lower urinary tract symptoms: Secondary | ICD-10-CM | POA: Diagnosis not present

## 2020-08-17 DIAGNOSIS — C7951 Secondary malignant neoplasm of bone: Secondary | ICD-10-CM | POA: Diagnosis not present

## 2020-08-17 DIAGNOSIS — Z85828 Personal history of other malignant neoplasm of skin: Secondary | ICD-10-CM | POA: Diagnosis not present

## 2020-08-17 DIAGNOSIS — C44722 Squamous cell carcinoma of skin of right lower limb, including hip: Secondary | ICD-10-CM | POA: Diagnosis not present

## 2020-08-17 DIAGNOSIS — C44319 Basal cell carcinoma of skin of other parts of face: Secondary | ICD-10-CM | POA: Diagnosis not present

## 2020-08-17 DIAGNOSIS — C61 Malignant neoplasm of prostate: Secondary | ICD-10-CM | POA: Diagnosis not present

## 2020-09-06 ENCOUNTER — Other Ambulatory Visit: Payer: Self-pay | Admitting: Urology

## 2020-09-06 DIAGNOSIS — H401131 Primary open-angle glaucoma, bilateral, mild stage: Secondary | ICD-10-CM | POA: Diagnosis not present

## 2020-09-06 DIAGNOSIS — H2513 Age-related nuclear cataract, bilateral: Secondary | ICD-10-CM | POA: Diagnosis not present

## 2020-09-06 DIAGNOSIS — C61 Malignant neoplasm of prostate: Secondary | ICD-10-CM

## 2020-09-06 DIAGNOSIS — C7951 Secondary malignant neoplasm of bone: Secondary | ICD-10-CM

## 2020-09-21 ENCOUNTER — Other Ambulatory Visit: Payer: Self-pay | Admitting: Internal Medicine

## 2020-09-21 DIAGNOSIS — M545 Low back pain, unspecified: Secondary | ICD-10-CM

## 2020-09-27 DIAGNOSIS — Z85828 Personal history of other malignant neoplasm of skin: Secondary | ICD-10-CM | POA: Diagnosis not present

## 2020-09-27 DIAGNOSIS — C44319 Basal cell carcinoma of skin of other parts of face: Secondary | ICD-10-CM | POA: Diagnosis not present

## 2020-10-03 ENCOUNTER — Ambulatory Visit
Admission: RE | Admit: 2020-10-03 | Discharge: 2020-10-03 | Disposition: A | Discharge status: Home / Self Care | Payer: Medicare Other | Source: Ambulatory Visit | Attending: Internal Medicine | Admitting: Internal Medicine

## 2020-10-03 ENCOUNTER — Other Ambulatory Visit: Payer: Self-pay

## 2020-10-03 DIAGNOSIS — M545 Low back pain, unspecified: Secondary | ICD-10-CM | POA: Diagnosis not present

## 2020-10-15 IMAGING — NM NM RENAL IMAGING FLOW W/ PHARM
2 series · 12 of 12 positions shown · non-contrast
Comparison: CT abdomen and pelvis 02/18/2019

CLINICAL DATA: Post LEFT ureteral stenting due to hydronephrosis
and ureteral stricture, stent removed 2 weeks ago

EXAM:
NUCLEAR MEDICINE RENAL SCAN WITH DIURETIC ADMINISTRATION
TECHNIQUE: Radionuclide angiographic and sequential renal images were obtained
after intravenous injection of radiopharmaceutical. Imaging was
continued during slow intravenous injection of Lasix approximately
15 minutes after the start of the examination.
RADIOPHARMACEUTICALS:  5.1 mCi Dechnetium-99m MAG3 IV
Pharmaceutical: 45 mg Lasix IV

[Series 1: renal scan · 4.14mm/px · 6 of 40 frames shown (1 of 2)]
[frame 4/40  full-range]
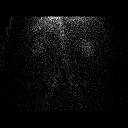
[frame 10/40  full-range]
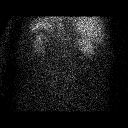
[frame 17/40  full-range]
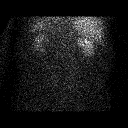
[frame 24/40  full-range]
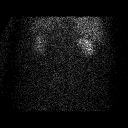
[frame 30/40  full-range]
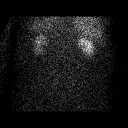
[frame 37/40  full-range]
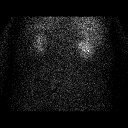

[Series 1: renal scan · 4.14mm/px · 6 of 78 frames shown (2 of 2)]
[frame 7/78]
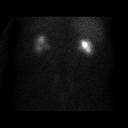
[frame 20/78]
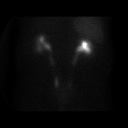
[frame 33/78]
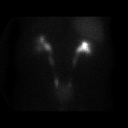
[frame 46/78]
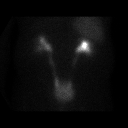
[frame 59/78]
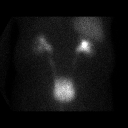
[frame 72/78]
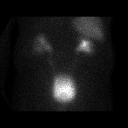

[12 of 12 positions shown; findings below may reference images not displayed]

FINDINGS: Flow: Prompt flow to RIGHT kidney.  Diminished flow to LEFT kidney.

Left renogram: Photopenic defect centrally at LEFT kidney consistent
with a mildly dilated collecting system. Normal uptake,
concentration and excretion of tracer. Dilated LEFT renal collecting
system and LEFT ureter. Mild washout of tracer from the dilated LEFT
renal collecting system is seen pre Lasix, accelerating following
diuretic administration. Mild residual within the collecting system
at the conclusion of the exam. Analysis of the renogram curve
demonstrates a normal time to peak activity of 5.7 minutes with a
fall to half maximum activity 30.8 minutes later.

Right renogram: Normal uptake, concentration and excretion of tracer
by RIGHT kidney. Mild dilatation of the RIGHT renal collecting
system and RIGHT ureter is identified. Suboptimal clearance of
tracer from the RIGHT renal collecting system prior to Lasix though
rapidly washing out following diuretic administration. Mild residual
tracer within dilated collecting system at conclusion of exam.
Analysis of the renogram curve demonstrates a delayed time to peak
activity of 15.2 minutes with fall to half maximum activity at 22
minutes.

Differential:

Left kidney = 34 %

Right kidney = 66 %

T1/2 post Lasix :

Left kidney = 14.4 min

Right kidney = 12.8 min
IMPRESSION: Dilated renal collecting systems and ureters bilaterally.

Adequate clearance of tracer following diuretic administration.

Asymmetric renal function, 34% LEFT versus 66% RIGHT.

## 2020-10-17 DIAGNOSIS — D1779 Benign lipomatous neoplasm of other sites: Secondary | ICD-10-CM | POA: Diagnosis not present

## 2020-10-20 DIAGNOSIS — E785 Hyperlipidemia, unspecified: Secondary | ICD-10-CM | POA: Diagnosis not present

## 2020-10-20 DIAGNOSIS — E1129 Type 2 diabetes mellitus with other diabetic kidney complication: Secondary | ICD-10-CM | POA: Diagnosis not present

## 2020-10-20 DIAGNOSIS — Z125 Encounter for screening for malignant neoplasm of prostate: Secondary | ICD-10-CM | POA: Diagnosis not present

## 2020-10-27 DIAGNOSIS — N1832 Chronic kidney disease, stage 3b: Secondary | ICD-10-CM | POA: Diagnosis not present

## 2020-10-27 DIAGNOSIS — I129 Hypertensive chronic kidney disease with stage 1 through stage 4 chronic kidney disease, or unspecified chronic kidney disease: Secondary | ICD-10-CM | POA: Diagnosis not present

## 2020-10-27 DIAGNOSIS — E669 Obesity, unspecified: Secondary | ICD-10-CM | POA: Diagnosis not present

## 2020-10-27 DIAGNOSIS — C61 Malignant neoplasm of prostate: Secondary | ICD-10-CM | POA: Diagnosis not present

## 2020-10-27 DIAGNOSIS — Z1331 Encounter for screening for depression: Secondary | ICD-10-CM | POA: Diagnosis not present

## 2020-10-27 DIAGNOSIS — E1129 Type 2 diabetes mellitus with other diabetic kidney complication: Secondary | ICD-10-CM | POA: Diagnosis not present

## 2020-10-27 DIAGNOSIS — R82998 Other abnormal findings in urine: Secondary | ICD-10-CM | POA: Diagnosis not present

## 2020-10-27 DIAGNOSIS — M545 Low back pain, unspecified: Secondary | ICD-10-CM | POA: Diagnosis not present

## 2020-10-27 DIAGNOSIS — C7951 Secondary malignant neoplasm of bone: Secondary | ICD-10-CM | POA: Diagnosis not present

## 2020-10-27 DIAGNOSIS — Z0000 Encounter for general adult medical examination without abnormal findings: Secondary | ICD-10-CM | POA: Diagnosis not present

## 2020-10-27 DIAGNOSIS — Z1389 Encounter for screening for other disorder: Secondary | ICD-10-CM | POA: Diagnosis not present

## 2020-10-27 DIAGNOSIS — C775 Secondary and unspecified malignant neoplasm of intrapelvic lymph nodes: Secondary | ICD-10-CM | POA: Diagnosis not present

## 2020-10-27 DIAGNOSIS — E785 Hyperlipidemia, unspecified: Secondary | ICD-10-CM | POA: Diagnosis not present

## 2020-12-02 DIAGNOSIS — M4696 Unspecified inflammatory spondylopathy, lumbar region: Secondary | ICD-10-CM | POA: Diagnosis not present

## 2020-12-06 DIAGNOSIS — M47816 Spondylosis without myelopathy or radiculopathy, lumbar region: Secondary | ICD-10-CM | POA: Diagnosis not present

## 2020-12-22 DIAGNOSIS — M47816 Spondylosis without myelopathy or radiculopathy, lumbar region: Secondary | ICD-10-CM | POA: Diagnosis not present

## 2021-01-20 DIAGNOSIS — M47816 Spondylosis without myelopathy or radiculopathy, lumbar region: Secondary | ICD-10-CM | POA: Diagnosis not present

## 2021-03-01 ENCOUNTER — Ambulatory Visit
Admission: RE | Admit: 2021-03-01 | Discharge: 2021-03-01 | Disposition: A | Discharge status: Home / Self Care | Payer: Medicare Other | Source: Ambulatory Visit | Attending: Urology | Admitting: Urology

## 2021-03-01 DIAGNOSIS — C61 Malignant neoplasm of prostate: Secondary | ICD-10-CM

## 2021-03-01 DIAGNOSIS — M8589 Other specified disorders of bone density and structure, multiple sites: Secondary | ICD-10-CM | POA: Diagnosis not present

## 2021-03-01 DIAGNOSIS — C7951 Secondary malignant neoplasm of bone: Secondary | ICD-10-CM

## 2021-03-10 DIAGNOSIS — H401131 Primary open-angle glaucoma, bilateral, mild stage: Secondary | ICD-10-CM | POA: Diagnosis not present

## 2021-03-10 DIAGNOSIS — H5203 Hypermetropia, bilateral: Secondary | ICD-10-CM | POA: Diagnosis not present

## 2021-03-14 DIAGNOSIS — M47816 Spondylosis without myelopathy or radiculopathy, lumbar region: Secondary | ICD-10-CM | POA: Diagnosis not present

## 2021-03-28 DIAGNOSIS — C61 Malignant neoplasm of prostate: Secondary | ICD-10-CM | POA: Diagnosis not present

## 2021-03-30 DIAGNOSIS — M47816 Spondylosis without myelopathy or radiculopathy, lumbar region: Secondary | ICD-10-CM | POA: Diagnosis not present

## 2021-04-04 DIAGNOSIS — R3912 Poor urinary stream: Secondary | ICD-10-CM | POA: Diagnosis not present

## 2021-04-04 DIAGNOSIS — C7951 Secondary malignant neoplasm of bone: Secondary | ICD-10-CM | POA: Diagnosis not present

## 2021-04-04 DIAGNOSIS — N401 Enlarged prostate with lower urinary tract symptoms: Secondary | ICD-10-CM | POA: Diagnosis not present

## 2021-04-04 DIAGNOSIS — C61 Malignant neoplasm of prostate: Secondary | ICD-10-CM | POA: Diagnosis not present

## 2021-04-21 DIAGNOSIS — H401131 Primary open-angle glaucoma, bilateral, mild stage: Secondary | ICD-10-CM | POA: Diagnosis not present

## 2021-04-24 DIAGNOSIS — M47816 Spondylosis without myelopathy or radiculopathy, lumbar region: Secondary | ICD-10-CM | POA: Diagnosis not present

## 2021-04-28 DIAGNOSIS — Z86010 Personal history of colonic polyps: Secondary | ICD-10-CM | POA: Diagnosis not present

## 2021-04-28 DIAGNOSIS — E669 Obesity, unspecified: Secondary | ICD-10-CM | POA: Diagnosis not present

## 2021-04-28 DIAGNOSIS — I10 Essential (primary) hypertension: Secondary | ICD-10-CM | POA: Diagnosis not present

## 2021-04-28 DIAGNOSIS — M8580 Other specified disorders of bone density and structure, unspecified site: Secondary | ICD-10-CM | POA: Diagnosis not present

## 2021-04-28 DIAGNOSIS — C61 Malignant neoplasm of prostate: Secondary | ICD-10-CM | POA: Diagnosis not present

## 2021-04-28 DIAGNOSIS — C775 Secondary and unspecified malignant neoplasm of intrapelvic lymph nodes: Secondary | ICD-10-CM | POA: Diagnosis not present

## 2021-04-28 DIAGNOSIS — E785 Hyperlipidemia, unspecified: Secondary | ICD-10-CM | POA: Diagnosis not present

## 2021-04-28 DIAGNOSIS — C7951 Secondary malignant neoplasm of bone: Secondary | ICD-10-CM | POA: Diagnosis not present

## 2021-04-28 DIAGNOSIS — N1832 Chronic kidney disease, stage 3b: Secondary | ICD-10-CM | POA: Diagnosis not present

## 2021-04-28 DIAGNOSIS — E1129 Type 2 diabetes mellitus with other diabetic kidney complication: Secondary | ICD-10-CM | POA: Diagnosis not present

## 2021-04-28 DIAGNOSIS — M545 Low back pain, unspecified: Secondary | ICD-10-CM | POA: Diagnosis not present

## 2021-04-28 DIAGNOSIS — I129 Hypertensive chronic kidney disease with stage 1 through stage 4 chronic kidney disease, or unspecified chronic kidney disease: Secondary | ICD-10-CM | POA: Diagnosis not present

## 2021-07-26 DIAGNOSIS — M25521 Pain in right elbow: Secondary | ICD-10-CM | POA: Diagnosis not present

## 2021-08-24 DIAGNOSIS — C44729 Squamous cell carcinoma of skin of left lower limb, including hip: Secondary | ICD-10-CM | POA: Diagnosis not present

## 2021-08-24 DIAGNOSIS — C44519 Basal cell carcinoma of skin of other part of trunk: Secondary | ICD-10-CM | POA: Diagnosis not present

## 2021-08-24 DIAGNOSIS — C44319 Basal cell carcinoma of skin of other parts of face: Secondary | ICD-10-CM | POA: Diagnosis not present

## 2021-08-24 DIAGNOSIS — L821 Other seborrheic keratosis: Secondary | ICD-10-CM | POA: Diagnosis not present

## 2021-08-24 DIAGNOSIS — Z85828 Personal history of other malignant neoplasm of skin: Secondary | ICD-10-CM | POA: Diagnosis not present

## 2021-08-24 DIAGNOSIS — L570 Actinic keratosis: Secondary | ICD-10-CM | POA: Diagnosis not present

## 2021-08-24 DIAGNOSIS — D0471 Carcinoma in situ of skin of right lower limb, including hip: Secondary | ICD-10-CM | POA: Diagnosis not present

## 2021-08-24 DIAGNOSIS — L814 Other melanin hyperpigmentation: Secondary | ICD-10-CM | POA: Diagnosis not present

## 2021-09-27 DIAGNOSIS — C61 Malignant neoplasm of prostate: Secondary | ICD-10-CM | POA: Diagnosis not present

## 2021-10-03 DIAGNOSIS — Z85828 Personal history of other malignant neoplasm of skin: Secondary | ICD-10-CM | POA: Diagnosis not present

## 2021-10-03 DIAGNOSIS — C441192 Basal cell carcinoma of skin of left lower eyelid, including canthus: Secondary | ICD-10-CM | POA: Diagnosis not present

## 2021-10-04 DIAGNOSIS — C7951 Secondary malignant neoplasm of bone: Secondary | ICD-10-CM | POA: Diagnosis not present

## 2021-10-04 DIAGNOSIS — C61 Malignant neoplasm of prostate: Secondary | ICD-10-CM | POA: Diagnosis not present

## 2021-10-04 DIAGNOSIS — R3912 Poor urinary stream: Secondary | ICD-10-CM | POA: Diagnosis not present

## 2021-10-04 DIAGNOSIS — N401 Enlarged prostate with lower urinary tract symptoms: Secondary | ICD-10-CM | POA: Diagnosis not present

## 2021-10-26 DIAGNOSIS — E785 Hyperlipidemia, unspecified: Secondary | ICD-10-CM | POA: Diagnosis not present

## 2021-10-26 DIAGNOSIS — E1129 Type 2 diabetes mellitus with other diabetic kidney complication: Secondary | ICD-10-CM | POA: Diagnosis not present

## 2021-10-26 DIAGNOSIS — I10 Essential (primary) hypertension: Secondary | ICD-10-CM | POA: Diagnosis not present

## 2021-10-26 DIAGNOSIS — R7989 Other specified abnormal findings of blood chemistry: Secondary | ICD-10-CM | POA: Diagnosis not present

## 2021-10-26 DIAGNOSIS — Z125 Encounter for screening for malignant neoplasm of prostate: Secondary | ICD-10-CM | POA: Diagnosis not present

## 2021-10-26 DIAGNOSIS — N1832 Chronic kidney disease, stage 3b: Secondary | ICD-10-CM | POA: Diagnosis not present

## 2021-11-02 DIAGNOSIS — Z1339 Encounter for screening examination for other mental health and behavioral disorders: Secondary | ICD-10-CM | POA: Diagnosis not present

## 2021-11-02 DIAGNOSIS — N1832 Chronic kidney disease, stage 3b: Secondary | ICD-10-CM | POA: Diagnosis not present

## 2021-11-02 DIAGNOSIS — M8580 Other specified disorders of bone density and structure, unspecified site: Secondary | ICD-10-CM | POA: Diagnosis not present

## 2021-11-02 DIAGNOSIS — C7951 Secondary malignant neoplasm of bone: Secondary | ICD-10-CM | POA: Diagnosis not present

## 2021-11-02 DIAGNOSIS — R82998 Other abnormal findings in urine: Secondary | ICD-10-CM | POA: Diagnosis not present

## 2021-11-02 DIAGNOSIS — C775 Secondary and unspecified malignant neoplasm of intrapelvic lymph nodes: Secondary | ICD-10-CM | POA: Diagnosis not present

## 2021-11-02 DIAGNOSIS — Z1331 Encounter for screening for depression: Secondary | ICD-10-CM | POA: Diagnosis not present

## 2021-11-02 DIAGNOSIS — E785 Hyperlipidemia, unspecified: Secondary | ICD-10-CM | POA: Diagnosis not present

## 2021-11-02 DIAGNOSIS — Z0000 Encounter for general adult medical examination without abnormal findings: Secondary | ICD-10-CM | POA: Diagnosis not present

## 2021-11-02 DIAGNOSIS — I129 Hypertensive chronic kidney disease with stage 1 through stage 4 chronic kidney disease, or unspecified chronic kidney disease: Secondary | ICD-10-CM | POA: Diagnosis not present

## 2021-11-02 DIAGNOSIS — I10 Essential (primary) hypertension: Secondary | ICD-10-CM | POA: Diagnosis not present

## 2021-11-02 DIAGNOSIS — E669 Obesity, unspecified: Secondary | ICD-10-CM | POA: Diagnosis not present

## 2021-11-02 DIAGNOSIS — M545 Low back pain, unspecified: Secondary | ICD-10-CM | POA: Diagnosis not present

## 2021-11-02 DIAGNOSIS — C61 Malignant neoplasm of prostate: Secondary | ICD-10-CM | POA: Diagnosis not present

## 2021-11-02 DIAGNOSIS — E1129 Type 2 diabetes mellitus with other diabetic kidney complication: Secondary | ICD-10-CM | POA: Diagnosis not present

## 2021-11-06 DIAGNOSIS — H401131 Primary open-angle glaucoma, bilateral, mild stage: Secondary | ICD-10-CM | POA: Diagnosis not present

## 2021-11-06 DIAGNOSIS — H2513 Age-related nuclear cataract, bilateral: Secondary | ICD-10-CM | POA: Diagnosis not present

## 2022-02-26 DIAGNOSIS — C44519 Basal cell carcinoma of skin of other part of trunk: Secondary | ICD-10-CM | POA: Diagnosis not present

## 2022-02-26 DIAGNOSIS — L821 Other seborrheic keratosis: Secondary | ICD-10-CM | POA: Diagnosis not present

## 2022-02-26 DIAGNOSIS — C44619 Basal cell carcinoma of skin of left upper limb, including shoulder: Secondary | ICD-10-CM | POA: Diagnosis not present

## 2022-02-26 DIAGNOSIS — Z85828 Personal history of other malignant neoplasm of skin: Secondary | ICD-10-CM | POA: Diagnosis not present

## 2022-02-26 DIAGNOSIS — L570 Actinic keratosis: Secondary | ICD-10-CM | POA: Diagnosis not present

## 2022-02-26 DIAGNOSIS — L814 Other melanin hyperpigmentation: Secondary | ICD-10-CM | POA: Diagnosis not present

## 2022-02-26 DIAGNOSIS — C4441 Basal cell carcinoma of skin of scalp and neck: Secondary | ICD-10-CM | POA: Diagnosis not present

## 2022-02-26 DIAGNOSIS — L905 Scar conditions and fibrosis of skin: Secondary | ICD-10-CM | POA: Diagnosis not present

## 2022-04-11 DIAGNOSIS — C61 Malignant neoplasm of prostate: Secondary | ICD-10-CM | POA: Diagnosis not present

## 2022-04-18 DIAGNOSIS — C61 Malignant neoplasm of prostate: Secondary | ICD-10-CM | POA: Diagnosis not present

## 2022-04-24 DIAGNOSIS — I129 Hypertensive chronic kidney disease with stage 1 through stage 4 chronic kidney disease, or unspecified chronic kidney disease: Secondary | ICD-10-CM | POA: Diagnosis not present

## 2022-04-24 DIAGNOSIS — N1832 Chronic kidney disease, stage 3b: Secondary | ICD-10-CM | POA: Diagnosis not present

## 2022-04-24 DIAGNOSIS — M8580 Other specified disorders of bone density and structure, unspecified site: Secondary | ICD-10-CM | POA: Diagnosis not present

## 2022-04-24 DIAGNOSIS — E785 Hyperlipidemia, unspecified: Secondary | ICD-10-CM | POA: Diagnosis not present

## 2022-04-24 DIAGNOSIS — C775 Secondary and unspecified malignant neoplasm of intrapelvic lymph nodes: Secondary | ICD-10-CM | POA: Diagnosis not present

## 2022-04-24 DIAGNOSIS — M545 Low back pain, unspecified: Secondary | ICD-10-CM | POA: Diagnosis not present

## 2022-04-24 DIAGNOSIS — C61 Malignant neoplasm of prostate: Secondary | ICD-10-CM | POA: Diagnosis not present

## 2022-04-24 DIAGNOSIS — E669 Obesity, unspecified: Secondary | ICD-10-CM | POA: Diagnosis not present

## 2022-04-24 DIAGNOSIS — Z86010 Personal history of colonic polyps: Secondary | ICD-10-CM | POA: Diagnosis not present

## 2022-04-24 DIAGNOSIS — E1129 Type 2 diabetes mellitus with other diabetic kidney complication: Secondary | ICD-10-CM | POA: Diagnosis not present

## 2022-04-24 DIAGNOSIS — C7951 Secondary malignant neoplasm of bone: Secondary | ICD-10-CM | POA: Diagnosis not present

## 2022-04-24 DIAGNOSIS — H409 Unspecified glaucoma: Secondary | ICD-10-CM | POA: Diagnosis not present

## 2022-04-27 DIAGNOSIS — H401131 Primary open-angle glaucoma, bilateral, mild stage: Secondary | ICD-10-CM | POA: Diagnosis not present

## 2022-04-27 DIAGNOSIS — H5203 Hypermetropia, bilateral: Secondary | ICD-10-CM | POA: Diagnosis not present

## 2022-06-08 DIAGNOSIS — H401131 Primary open-angle glaucoma, bilateral, mild stage: Secondary | ICD-10-CM | POA: Diagnosis not present

## 2022-11-09 DIAGNOSIS — C61 Malignant neoplasm of prostate: Secondary | ICD-10-CM | POA: Diagnosis not present

## 2022-11-13 DIAGNOSIS — N1832 Chronic kidney disease, stage 3b: Secondary | ICD-10-CM | POA: Diagnosis not present

## 2022-11-13 DIAGNOSIS — E785 Hyperlipidemia, unspecified: Secondary | ICD-10-CM | POA: Diagnosis not present

## 2022-11-13 DIAGNOSIS — Z79899 Other long term (current) drug therapy: Secondary | ICD-10-CM | POA: Diagnosis not present

## 2022-11-13 DIAGNOSIS — I129 Hypertensive chronic kidney disease with stage 1 through stage 4 chronic kidney disease, or unspecified chronic kidney disease: Secondary | ICD-10-CM | POA: Diagnosis not present

## 2022-11-13 DIAGNOSIS — E7849 Other hyperlipidemia: Secondary | ICD-10-CM | POA: Diagnosis not present

## 2022-11-13 DIAGNOSIS — E1129 Type 2 diabetes mellitus with other diabetic kidney complication: Secondary | ICD-10-CM | POA: Diagnosis not present

## 2022-11-13 DIAGNOSIS — C61 Malignant neoplasm of prostate: Secondary | ICD-10-CM | POA: Diagnosis not present

## 2022-11-14 DIAGNOSIS — D649 Anemia, unspecified: Secondary | ICD-10-CM | POA: Diagnosis not present

## 2022-11-16 DIAGNOSIS — C7951 Secondary malignant neoplasm of bone: Secondary | ICD-10-CM | POA: Diagnosis not present

## 2022-11-16 DIAGNOSIS — N401 Enlarged prostate with lower urinary tract symptoms: Secondary | ICD-10-CM | POA: Diagnosis not present

## 2022-11-16 DIAGNOSIS — R3912 Poor urinary stream: Secondary | ICD-10-CM | POA: Diagnosis not present

## 2022-11-16 DIAGNOSIS — C61 Malignant neoplasm of prostate: Secondary | ICD-10-CM | POA: Diagnosis not present

## 2022-11-20 DIAGNOSIS — N1832 Chronic kidney disease, stage 3b: Secondary | ICD-10-CM | POA: Diagnosis not present

## 2022-11-20 DIAGNOSIS — E1129 Type 2 diabetes mellitus with other diabetic kidney complication: Secondary | ICD-10-CM | POA: Diagnosis not present

## 2022-11-20 DIAGNOSIS — C61 Malignant neoplasm of prostate: Secondary | ICD-10-CM | POA: Diagnosis not present

## 2022-11-20 DIAGNOSIS — E669 Obesity, unspecified: Secondary | ICD-10-CM | POA: Diagnosis not present

## 2022-11-20 DIAGNOSIS — Z1339 Encounter for screening examination for other mental health and behavioral disorders: Secondary | ICD-10-CM | POA: Diagnosis not present

## 2022-11-20 DIAGNOSIS — I129 Hypertensive chronic kidney disease with stage 1 through stage 4 chronic kidney disease, or unspecified chronic kidney disease: Secondary | ICD-10-CM | POA: Diagnosis not present

## 2022-11-20 DIAGNOSIS — R82998 Other abnormal findings in urine: Secondary | ICD-10-CM | POA: Diagnosis not present

## 2022-11-20 DIAGNOSIS — Z1331 Encounter for screening for depression: Secondary | ICD-10-CM | POA: Diagnosis not present

## 2022-11-20 DIAGNOSIS — C775 Secondary and unspecified malignant neoplasm of intrapelvic lymph nodes: Secondary | ICD-10-CM | POA: Diagnosis not present

## 2022-11-20 DIAGNOSIS — Z0000 Encounter for general adult medical examination without abnormal findings: Secondary | ICD-10-CM | POA: Diagnosis not present

## 2022-11-20 DIAGNOSIS — C7951 Secondary malignant neoplasm of bone: Secondary | ICD-10-CM | POA: Diagnosis not present

## 2022-11-20 DIAGNOSIS — E785 Hyperlipidemia, unspecified: Secondary | ICD-10-CM | POA: Diagnosis not present

## 2022-11-20 DIAGNOSIS — M8580 Other specified disorders of bone density and structure, unspecified site: Secondary | ICD-10-CM | POA: Diagnosis not present

## 2022-12-14 DIAGNOSIS — H401131 Primary open-angle glaucoma, bilateral, mild stage: Secondary | ICD-10-CM | POA: Diagnosis not present

## 2022-12-14 DIAGNOSIS — H2513 Age-related nuclear cataract, bilateral: Secondary | ICD-10-CM | POA: Diagnosis not present

## 2023-05-08 DIAGNOSIS — H10413 Chronic giant papillary conjunctivitis, bilateral: Secondary | ICD-10-CM | POA: Diagnosis not present

## 2023-05-17 DIAGNOSIS — C61 Malignant neoplasm of prostate: Secondary | ICD-10-CM | POA: Diagnosis not present

## 2023-05-22 DIAGNOSIS — E1129 Type 2 diabetes mellitus with other diabetic kidney complication: Secondary | ICD-10-CM | POA: Diagnosis not present

## 2023-05-22 DIAGNOSIS — M8580 Other specified disorders of bone density and structure, unspecified site: Secondary | ICD-10-CM | POA: Diagnosis not present

## 2023-05-22 DIAGNOSIS — I129 Hypertensive chronic kidney disease with stage 1 through stage 4 chronic kidney disease, or unspecified chronic kidney disease: Secondary | ICD-10-CM | POA: Diagnosis not present

## 2023-05-22 DIAGNOSIS — N1832 Chronic kidney disease, stage 3b: Secondary | ICD-10-CM | POA: Diagnosis not present

## 2023-05-22 DIAGNOSIS — D638 Anemia in other chronic diseases classified elsewhere: Secondary | ICD-10-CM | POA: Diagnosis not present

## 2023-05-22 DIAGNOSIS — C61 Malignant neoplasm of prostate: Secondary | ICD-10-CM | POA: Diagnosis not present

## 2023-05-22 DIAGNOSIS — C7951 Secondary malignant neoplasm of bone: Secondary | ICD-10-CM | POA: Diagnosis not present

## 2023-05-22 DIAGNOSIS — E785 Hyperlipidemia, unspecified: Secondary | ICD-10-CM | POA: Diagnosis not present

## 2023-05-22 DIAGNOSIS — C775 Secondary and unspecified malignant neoplasm of intrapelvic lymph nodes: Secondary | ICD-10-CM | POA: Diagnosis not present

## 2023-05-24 DIAGNOSIS — C61 Malignant neoplasm of prostate: Secondary | ICD-10-CM | POA: Diagnosis not present

## 2023-05-24 DIAGNOSIS — R3912 Poor urinary stream: Secondary | ICD-10-CM | POA: Diagnosis not present

## 2023-05-24 DIAGNOSIS — N401 Enlarged prostate with lower urinary tract symptoms: Secondary | ICD-10-CM | POA: Diagnosis not present

## 2023-05-24 DIAGNOSIS — C7951 Secondary malignant neoplasm of bone: Secondary | ICD-10-CM | POA: Diagnosis not present

## 2023-05-27 ENCOUNTER — Other Ambulatory Visit (HOSPITAL_COMMUNITY): Payer: Self-pay | Admitting: Urology

## 2023-05-27 DIAGNOSIS — C61 Malignant neoplasm of prostate: Secondary | ICD-10-CM

## 2023-05-27 DIAGNOSIS — C7951 Secondary malignant neoplasm of bone: Secondary | ICD-10-CM

## 2023-06-12 ENCOUNTER — Encounter (HOSPITAL_COMMUNITY)
Admission: RE | Admit: 2023-06-12 | Discharge: 2023-06-12 | Disposition: A | Discharge status: Home / Self Care | Source: Ambulatory Visit | Attending: Urology | Admitting: Urology

## 2023-06-12 DIAGNOSIS — C7951 Secondary malignant neoplasm of bone: Secondary | ICD-10-CM | POA: Insufficient documentation

## 2023-06-12 DIAGNOSIS — C7952 Secondary malignant neoplasm of bone marrow: Secondary | ICD-10-CM | POA: Insufficient documentation

## 2023-06-12 DIAGNOSIS — C61 Malignant neoplasm of prostate: Secondary | ICD-10-CM | POA: Insufficient documentation

## 2023-06-12 MED ORDER — FLOTUFOLASTAT F 18 GALLIUM 296-5846 MBQ/ML IV SOLN
8.5000 | Freq: Once | INTRAVENOUS | Status: AC
  Administered 2023-06-12: 8.5 via INTRAVENOUS

## 2023-06-18 DIAGNOSIS — D3131 Benign neoplasm of right choroid: Secondary | ICD-10-CM | POA: Diagnosis not present

## 2023-06-18 DIAGNOSIS — H401131 Primary open-angle glaucoma, bilateral, mild stage: Secondary | ICD-10-CM | POA: Diagnosis not present

## 2023-07-09 DIAGNOSIS — C7951 Secondary malignant neoplasm of bone: Secondary | ICD-10-CM | POA: Diagnosis not present

## 2023-07-09 DIAGNOSIS — C61 Malignant neoplasm of prostate: Secondary | ICD-10-CM | POA: Diagnosis not present

## 2023-07-09 NOTE — Progress Notes (Signed)
 Histology and Location of Primary Cancer: Prostate Ca  Sites of Visceral and Bony Metastatic Disease: Lesion L2 vertebral body  Location(s) of Symptomatic Metastases: Lesion L2 vertebral body  Biopsies 04/19/2008   06/15/2023 Dr. Florencio Hunting NM PET (PSMA) Skull to Mid Thigh CLINICAL DATA:  Prostate carcinoma. Radiation treatment 11 years prior. Rising PSA equal 4.79.  IMPRESSION: 1. Intensely radiotracer avid sclerotic lesion in the L2 vertebral body consistent with solitary skeletal metastasis. 2. No evidence of local prostate carcinoma recurrence in the prostate gland. 3. No evidence of nodal metastasis in the pelvis or periaortic retroperitoneum. 4. No evidence of visceral metastasis. 5. Non radiotracer avid 5 mm nodule in the LEFT lower lobe is favored benign.  10/03/2020 Dr. Aimee Houseman. Ledell Prudent. MR Lumbar Spine without Contrast CLINICAL DATA:  Low back pain, radiating into the left hip, history of prostate cancer.  IMPRESSION: 1. Degenerative disc disease and epidural lipomatosis cause multilevel thecal sac narrowing worst at L3-L4, where it is severe, and L2-L3 where it is moderate to severe. 2. Multilevel facet arthropathy, which causes severe left neural foraminal narrowing at L4-L5 and moderate right neural foraminal at L5-S1. 3. T1 and T2 hypointense lesion in the L2 vertebral body correlates with a sclerotic lesion on a prior CT, likely sequela prior metastatic disease. No new suspicious lesions are seen.   Past/Anticipated chemotherapy by medical oncology, if any: NA  Pain on a scale of 0-10 is:  0/10  If Spine Met(s), symptoms, if any, include: Bowel/Bladder retention or incontinence (please describe): No Numbness or weakness in extremities (please describe):  No Current Decadron  regimen, if applicable:  No  Ambulatory status? Walker? Wheelchair?: Independent  SAFETY ISSUES: Prior radiation? Yes, prostate XRT 11 years ago. Pacemaker/ICD? No Possible  current pregnancy? Male Is the patient on methotrexate? No  Current Complaints / other details:

## 2023-07-12 ENCOUNTER — Ambulatory Visit
Admission: RE | Admit: 2023-07-12 | Discharge: 2023-07-12 | Disposition: A | Discharge status: Home / Self Care | Source: Ambulatory Visit | Attending: Radiation Oncology | Admitting: Radiation Oncology

## 2023-07-12 ENCOUNTER — Encounter: Payer: Self-pay | Admitting: Radiation Oncology

## 2023-07-12 ENCOUNTER — Ambulatory Visit
Admission: RE | Admit: 2023-07-12 | Discharge: 2023-07-12 | Disposition: A | Discharge status: Home / Self Care | Payer: PRIVATE HEALTH INSURANCE | Source: Ambulatory Visit | Attending: Urology | Admitting: Urology

## 2023-07-12 VITALS — BP 148/78 | HR 54 | Temp 96.9°F | Resp 18 | Ht 70.0 in | Wt 234.1 lb

## 2023-07-12 DIAGNOSIS — C7951 Secondary malignant neoplasm of bone: Secondary | ICD-10-CM | POA: Diagnosis not present

## 2023-07-12 DIAGNOSIS — C61 Malignant neoplasm of prostate: Secondary | ICD-10-CM | POA: Insufficient documentation

## 2023-07-12 DIAGNOSIS — N200 Calculus of kidney: Secondary | ICD-10-CM | POA: Diagnosis not present

## 2023-07-12 DIAGNOSIS — I10 Essential (primary) hypertension: Secondary | ICD-10-CM | POA: Diagnosis not present

## 2023-07-12 DIAGNOSIS — Z79899 Other long term (current) drug therapy: Secondary | ICD-10-CM | POA: Diagnosis not present

## 2023-07-12 DIAGNOSIS — E785 Hyperlipidemia, unspecified: Secondary | ICD-10-CM | POA: Diagnosis not present

## 2023-07-12 DIAGNOSIS — Z87442 Personal history of urinary calculi: Secondary | ICD-10-CM | POA: Diagnosis not present

## 2023-07-12 DIAGNOSIS — Z87891 Personal history of nicotine dependence: Secondary | ICD-10-CM | POA: Diagnosis not present

## 2023-07-12 DIAGNOSIS — Z510 Encounter for antineoplastic radiation therapy: Secondary | ICD-10-CM | POA: Diagnosis not present

## 2023-07-12 DIAGNOSIS — Z7982 Long term (current) use of aspirin: Secondary | ICD-10-CM | POA: Diagnosis not present

## 2023-07-12 DIAGNOSIS — Z923 Personal history of irradiation: Secondary | ICD-10-CM | POA: Diagnosis not present

## 2023-07-12 DIAGNOSIS — R9721 Rising PSA following treatment for malignant neoplasm of prostate: Secondary | ICD-10-CM | POA: Diagnosis not present

## 2023-07-12 NOTE — Progress Notes (Signed)
  Radiation Oncology         [phone] ________________________________  Name: Levi Cervantes MRN: [id number]  Date: 07/12/2023  DOB: 11-25-1931  STEREOTACTIC BODY RADIOTHERAPY SIMULATION AND TREATMENT PLANNING NOTE    ICD-10-CM   1. 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  NARRATIVE:  The patient was brought to the CT Simulation planning suite.  Identity was confirmed.  All relevant records and images related to the planned course of therapy were reviewed.  The patient freely provided informed written consent to proceed with treatment after reviewing the details related to the planned course of therapy. The consent form was witnessed and verified by the simulation staff.  Then, the patient was set-up in a stable reproducible  supine position for radiation therapy.  A BodyFix immobilization pillow was fabricated for reproducible positioning.  Surface markings were placed.  The CT images were loaded into the planning software.  The gross target volumes (GTV) and planning target volumes (PTV) were delinieated, and avoidance structures were contoured.  Treatment planning then occurred.  The radiation prescription was entered and confirmed.  A total of two complex treatment devices were fabricated in the form of the BodyFix immobilization pillow and a neck accuform cushion.  I have requested : 3D Simulation  I have requested a DVH of the following structures: targets and all normal structures near the target including kidneys, bowel, spinal cord, skin and others as noted on the radiation plan to maintain doses in adherence with established limits  SPECIAL TREATMENT PROCEDURE:  The planned course of therapy using radiation constitutes a special treatment procedure. Special care  is required in the management of this patient for the following reasons. High dose per fraction requiring special monitoring for increased toxicities of treatment including daily imaging..  The special nature of the planned course of radiotherapy will require increased physician supervision and oversight to ensure patient's safety with optimal treatment outcomes.    This requires extended time and effort.    PLAN:  The patient will receive 40 Gy in 5 fractions.  ________________________________  Trilby Fujisawa Lorri Rota, M.D.

## 2023-07-12 NOTE — Progress Notes (Signed)
 Levi Cervantes

## 2023-07-12 NOTE — Progress Notes (Signed)
 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    ICD-10-CM   1. Malignant neoplasm of prostate metastatic to bone [facility])  C61    C79.51       HISTORY OF PRESENT ILLNESS: Levi Cervantes is a 89 y.o. male seen at the request of Dr. Rozanne Corners. He has a history of prostate cancer, initially diagnosed with Gleason 4+3 in 04/2008 by Dr. Nolon Baxter with a PSA of 10.99. He was treated with upfront degerelix (to downsize the prostate) followed by IMRT under the care of Dr. Ike Malady, completed in 10/2008. His PSA reached a nadir of 0.54 in 12/2009 but subsequently began rising. He technically developed BCR in 2015 but elected to continue surveillance only, through 2020, under the care of of Dr. Rozanne Corners.  His PSA reached 44.34 in 09/2018 prompting further evaluation. A PET scan was performed on 11/18/18 for disease restaging and revealed oligometastatic disease in a few extraperitoneal lymph nodes and a solitary osseous lesion in the L2 vertebral body. He was started on ADT with Eligard  in 11/2018 which he has continued since that time. His PSA responded nicely and remained under 1 through 2024. Most recently, his PSA increased from 0.96 in 10/2022 to 4.79 in 04/2023. This prompted a restaging PSMA PET scan that was performed on 06/12/23 and showed an intensely radiotracer-avid sclerotic lesion in the L2 vertebral body but no evidence of local prostate recurrence, nodal metastasis, or visceral metastasis.    He has reviewed the PSMA PET imaging results with his urologist and has been kindly referred to us  today to discuss the potential role  for metastasis directed radiotherapy in the management of oligometastatic disease.  PREVIOUS RADIATION THERAPY: Yes  08/24/2008 - 10/20/2008: The prostate was treated to 78 Gy in 40 fractions while simultaneously treating the seminal vesicles to 56 Gy in 40 fractions (Dr. Ike Malady)  PAST MEDICAL HISTORY:  Past Medical History:  Diagnosis Date   AKI (acute kidney injury) (HCC) 01/2019   Arthritis    Glaucoma    High interocular pressure   History of kidney stones    Hyperlipidemia    Hypertension    Prostate cancer (HCC) 2011   Radiation tx      PAST SURGICAL HISTORY: Past Surgical History:  Procedure Laterality Date   CARPAL TUNNEL RELEASE Right 2011   COLONOSCOPY W/ BIOPSIES AND POLYPECTOMY  2012   CYSTOSCOPY  11/2018   [facility]   CYSTOSCOPY  12/2018   [facility]   CYSTOSCOPY WITH URETEROSCOPY AND STENT PLACEMENT Left 04/02/2019   Procedure: CYSTOSCOPY WITH URETEROSCOPY;  Surgeon: Florencio Hunting, MD;  Location: [facility];  Service: Urology;  Laterality: Left;   NEPHROSTOMY TUBE REMOVED  01/2019   PERCUTANEOUS NEPHROLOITHOTOMY  01/23/2019   [facility]    TONSILLECTOMY  1945    FAMILY HISTORY:  Family History  Problem Relation Age of Onset   Colon cancer Neg Hx     SOCIAL HISTORY:  Social History   Socioeconomic History   Marital status: Widowed    Spouse name: Not on file   Number of children: Not on file   Years of education: Not on  file   Highest education level: Not on file  Occupational History   Not on file  Tobacco Use   Smoking status: Former    Current packs/day: 0.00    Average packs/day: 0.5 packs/day for 10.0 years (5.0 ttl pk-yrs)    Types: Cigarettes    Start date: 02/20/1952    Quit date: 02/19/1962    Years since quitting: 61.4   Smokeless tobacco: Never  Vaping Use   Vaping status: Never Used  Substance and Sexual Activity   Alcohol  use: Yes    Alcohol /week: 10.0 standard drinks of alcohol     Types: 10 Standard drinks or equivalent per  week    Comment: 1 GLASS WINE DAILY   Drug use: Never   Sexual activity: Not on file  Other Topics Concern   Not on file  Social History Narrative   Not on file   Social Drivers of Health   Financial Resource Strain: Not on file  Food Insecurity: No Food Insecurity (07/12/2023)   Hunger Vital Sign    Worried About Running Out of Food in the Last Year: Never true    Ran Out of Food in the Last Year: Never true  Transportation Needs: No Transportation Needs (07/12/2023)   PRAPARE - Administrator, Civil Service (Medical): No    Lack of Transportation (Non-Medical): No  Physical Activity: Not on file  Stress: Not on file  Social Connections: Not on file  Intimate Partner Violence: Not At Risk (07/12/2023)   Humiliation, Afraid, Rape, and Kick questionnaire    Fear of Current or Ex-Partner: No    Emotionally Abused: No    Physically Abused: No    Sexually Abused: No    ALLERGIES: Patient has no known allergies.  MEDICATIONS:  Current Outpatient Medications  Medication Sig Dispense Refill   aspirin 81 MG tablet Take 81 mg by mouth daily.       leuprolide , 6 Month, (LEUPROLIDE  ACETATE, 6 MONTH,) 45 MG injection as directed Subcutaneous every 6 months     Multiple Vitamins-Minerals (MULTIVITAMIN WITH MINERALS) tablet Take 1 tablet by mouth daily.       ramipril  (ALTACE ) 10 MG capsule Take 10 mg by mouth at bedtime.      simvastatin  (ZOCOR ) 40 MG tablet Take 40 mg by mouth at bedtime.       tamsulosin (FLOMAX) 0.4 MG CAPS capsule Take 0.4 mg by mouth daily.     timolol  (TIMOPTIC ) 0.5 % ophthalmic solution Place 1 drop into both eyes 2 (two) times daily. Each eye.     Travoprost, BAK Free, (TRAVATAN) 0.004 % SOLN ophthalmic solution INSTILL 1 DROP IN BOTH EYES EVERY NIGHT AT BEDTIME     No current facility-administered medications for this encounter.    REVIEW OF SYSTEMS:  On review of systems, the patient reports that he is doing well overall.  He denies any chest  pain, shortness of breath, cough, fevers, chills, night sweats, unintended weight changes.  He denies any bowel or bladder disturbances, and denies abdominal pain, nausea or vomiting.  He denies any new musculoskeletal or joint aches or pains. A complete review of systems is obtained and is otherwise negative.    PHYSICAL EXAM:  Wt Readings from Last 3 Encounters:  07/12/23 234 lb 2 oz (106.2 kg)  04/23/19 200 lb (90.7 kg)  04/02/19 214 lb (97.1 kg)   Temp Readings from Last 3 Encounters:  07/12/23 (!) 96.9 F (36.1 C) (Temporal)  04/02/19 (!) 97.4 F (  36.3 C)  02/22/19 98.9 F (37.2 C) (Oral)   BP Readings from Last 3 Encounters:  07/12/23 (!) 148/78  04/02/19 (!) 143/80  02/22/19 133/78   Pulse Readings from Last 3 Encounters:  07/12/23 (!) 54  04/02/19 71  02/22/19 78   Pain Assessment Pain Score: 0-No pain/10  In general this is a well appearing Caucasian man in no acute distress.  He's alert and oriented x4 and appropriate throughout the examination. Cardiopulmonary assessment is negative for acute distress and he exhibits normal effort.   KPS = 100  100 - Normal; no complaints; no evidence of disease. 90   - Able to carry on normal activity; minor signs or symptoms of disease. 80   - Normal activity with effort; some signs or symptoms of disease. 22   - Cares for self; unable to carry on normal activity or to do active work. 60   - Requires occasional assistance, but is able to care for most of his personal needs. 50   - Requires considerable assistance and frequent medical care. 40   - Disabled; requires special care and assistance. 30   - Severely disabled; hospital admission is indicated although death not imminent. 20   - Very sick; hospital admission necessary; active supportive treatment necessary. 10   - Moribund; fatal processes progressing rapidly. 0     - Dead  Karnofsky DA, Abelmann WH, Craver LS and Burchenal [facility] [phone]) The use of the nitrogen mustards  in the palliative treatment of carcinoma: with particular reference to bronchogenic carcinoma Cancer 1 [phone]  LABORATORY DATA:  Lab Results  Component Value Date   WBC 9.6 02/21/2019   HGB 11.2 (L) 04/02/2019   HCT 33.0 (L) 04/02/2019   MCV 100.8 (H) 02/21/2019   PLT 313 02/21/2019   Lab Results  Component Value Date   NA 139 04/02/2019   K 4.5 04/02/2019   CL 106 04/02/2019   CO2 23 02/20/2019   Lab Results  Component Value Date   ALT 53 (H) 02/18/2019   AST 25 02/18/2019   ALKPHOS 113 02/18/2019   BILITOT 0.6 02/18/2019     RADIOGRAPHY: NM PET (PSMA) SKULL TO MID THIGH Result Date: 06/13/2023 CLINICAL DATA:  Prostate carcinoma. Radiation treatment 11 years prior. Rising PSA equal 4.79. EXAM: NUCLEAR MEDICINE PET SKULL BASE TO THIGH TECHNIQUE: 8.5 mCi Flotufolastat (Posluma ) was injected intravenously. Full-ring PET imaging was performed from the skull base to thigh after the radiotracer. CT data was obtained and used for attenuation correction and anatomic localization. COMPARISON:  CT abdomen pelvis 02/18/2019 FINDINGS: NECK No radiotracer activity in neck lymph nodes. Incidental CT finding: None. CHEST No radiotracer accumulation within mediastinal or hilar lymph nodes. 5 mm nodule in the LEFT lower lobe (image 91) does not have radiotracer activity. Incidental CT finding: None. ABDOMEN/PELVIS Prostate: No focal activity within the prostate gland. Fiducial markers noted. Lymph nodes: No abnormal radiotracer accumulation within pelvic or abdominal nodes. Liver: No evidence of liver metastasis. Incidental CT finding: Nonobstructing LEFT renal calculus. Simple fluid attenuation cyst of the RIGHT kidney. SKELETON Intensely radiotracer avid lesion in the in the L2 vertebral body with SUV max equal 22 on image 123. There is a densely sclerotic lesion at this site which corresponds the activity measuring 18 mm on image 125. There is a larger sclerotic lesion at this site on comparison CT  from 2020 measuring 26 mm. No additional radiotracer avid skeletal lesions. IMPRESSION: 1. Intensely radiotracer avid sclerotic lesion in the L2  vertebral body consistent with solitary skeletal metastasis. 2. No evidence of local prostate carcinoma recurrence in the prostate gland. 3. No evidence of nodal metastasis in the pelvis or periaortic retroperitoneum. 4. No evidence of visceral metastasis. 5. Non radiotracer avid 5 mm nodule in the LEFT lower lobe is favored benign. Electronically Signed   By: Deboraha Fallow M.D.   On: 06/13/2023 20:53      IMPRESSION/PLAN: 1. 89 y.o. 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  Today, we talked to the patient about the findings and workup thus far. We discussed the natural history of prostate cancer and general treatment, highlighting the role of metastasis directed radiotherapy in the management of oligometastatic disease. We discussed the available radiation techniques, and focused on the details and logistics of delivery.  The recommendation is for a 5 fraction course of stereotactic body radiotherapy (SBRT) to the solitary osseous metastasis at L2.  We reviewed the anticipated acute and late sequelae associated with radiation in this setting. The patient was encouraged to ask questions that were answered to his stated satisfaction.  At the end of our conversation, the patient is in agreement to proceed with the recommended 5 fraction course of stereotactic body radiotherapy (SBRT) to the solitary osseous metastasis at L2.  He has freely signed written consent to proceed today in the office and a copy of this document will be placed in his medical record.  We will share our discussion with Dr. Rozanne Corners and the patient will proceed with CT simulation/treatment planning following our visit today, in anticipation of beginning his treatments in the near future.  We enjoyed meeting  him today and look forward to continuing to participate in his care.   We personally spent 60 minutes in this encounter including chart review, reviewing radiological studies, meeting face-to-face with the patient, entering orders, coordinating care and completing documentation.    Arta Bihari, PA-C    Kenith Payer, MD  [facility]  Radiation Oncology Direct Dial: [phone]  Fax: [phone] [email or web address]  Skype  LinkedIn   This document serves as a record of services personally performed by Kenith Payer, MD and Keitha Pata, PA-C. It was created on their behalf by Florance Hun, a trained medical scribe. The creation of this record is based on the scribe's personal observations and the provider's statements to them. This document has been checked and approved by the attending provider.

## 2023-07-16 DIAGNOSIS — Z510 Encounter for antineoplastic radiation therapy: Secondary | ICD-10-CM | POA: Diagnosis not present

## 2023-07-16 DIAGNOSIS — C7951 Secondary malignant neoplasm of bone: Secondary | ICD-10-CM | POA: Diagnosis not present

## 2023-07-16 DIAGNOSIS — C61 Malignant neoplasm of prostate: Secondary | ICD-10-CM | POA: Diagnosis not present

## 2023-07-24 ENCOUNTER — Ambulatory Visit
Admission: RE | Admit: 2023-07-24 | Discharge: 2023-07-24 | Disposition: A | Discharge status: Home / Self Care | Payer: PRIVATE HEALTH INSURANCE | Source: Ambulatory Visit | Attending: Radiation Oncology | Admitting: Radiation Oncology

## 2023-07-24 ENCOUNTER — Other Ambulatory Visit: Payer: Self-pay

## 2023-07-24 DIAGNOSIS — C7951 Secondary malignant neoplasm of bone: Secondary | ICD-10-CM | POA: Insufficient documentation

## 2023-07-24 DIAGNOSIS — C61 Malignant neoplasm of prostate: Secondary | ICD-10-CM | POA: Insufficient documentation

## 2023-07-24 DIAGNOSIS — Z510 Encounter for antineoplastic radiation therapy: Secondary | ICD-10-CM | POA: Insufficient documentation

## 2023-07-24 LAB — RAD ONC ARIA SESSION SUMMARY
Course Elapsed Days: 0
Plan Fractions Treated to Date: 1
Plan Prescribed Dose Per Fraction: 8 Gy
Plan Total Fractions Prescribed: 5
Plan Total Prescribed Dose: 40 Gy
Reference Point Dosage Given to Date: 8 Gy
Reference Point Session Dosage Given: 8 Gy
Session Number: 1

## 2023-07-25 ENCOUNTER — Ambulatory Visit: Payer: PRIVATE HEALTH INSURANCE

## 2023-07-26 ENCOUNTER — Ambulatory Visit
Admission: RE | Admit: 2023-07-26 | Discharge: 2023-07-26 | Disposition: A | Discharge status: Home / Self Care | Payer: PRIVATE HEALTH INSURANCE | Source: Ambulatory Visit | Attending: Radiation Oncology | Admitting: Radiation Oncology

## 2023-07-26 ENCOUNTER — Other Ambulatory Visit: Payer: Self-pay

## 2023-07-26 DIAGNOSIS — Z510 Encounter for antineoplastic radiation therapy: Secondary | ICD-10-CM | POA: Diagnosis not present

## 2023-07-26 LAB — RAD ONC ARIA SESSION SUMMARY
Course Elapsed Days: 2
Plan Fractions Treated to Date: 2
Plan Prescribed Dose Per Fraction: 8 Gy
Plan Total Fractions Prescribed: 5
Plan Total Prescribed Dose: 40 Gy
Reference Point Dosage Given to Date: 16 Gy
Reference Point Session Dosage Given: 8 Gy
Session Number: 2

## 2023-07-29 ENCOUNTER — Other Ambulatory Visit: Payer: Self-pay

## 2023-07-29 ENCOUNTER — Ambulatory Visit
Admission: RE | Admit: 2023-07-29 | Discharge: 2023-07-29 | Disposition: A | Discharge status: Home / Self Care | Payer: PRIVATE HEALTH INSURANCE | Source: Ambulatory Visit | Attending: Radiation Oncology | Admitting: Radiation Oncology

## 2023-07-29 DIAGNOSIS — Z510 Encounter for antineoplastic radiation therapy: Secondary | ICD-10-CM | POA: Diagnosis not present

## 2023-07-29 LAB — RAD ONC ARIA SESSION SUMMARY
Course Elapsed Days: 5
Plan Fractions Treated to Date: 3
Plan Prescribed Dose Per Fraction: 8 Gy
Plan Total Fractions Prescribed: 5
Plan Total Prescribed Dose: 40 Gy
Reference Point Dosage Given to Date: 24 Gy
Reference Point Session Dosage Given: 8 Gy
Session Number: 3

## 2023-07-30 ENCOUNTER — Ambulatory Visit: Payer: PRIVATE HEALTH INSURANCE

## 2023-07-31 ENCOUNTER — Ambulatory Visit
Admission: RE | Admit: 2023-07-31 | Discharge: 2023-07-31 | Disposition: A | Discharge status: Home / Self Care | Payer: PRIVATE HEALTH INSURANCE | Source: Ambulatory Visit | Attending: Radiation Oncology | Admitting: Radiation Oncology

## 2023-07-31 ENCOUNTER — Other Ambulatory Visit: Payer: Self-pay

## 2023-07-31 DIAGNOSIS — Z510 Encounter for antineoplastic radiation therapy: Secondary | ICD-10-CM | POA: Diagnosis not present

## 2023-07-31 LAB — RAD ONC ARIA SESSION SUMMARY
Course Elapsed Days: 7
Plan Fractions Treated to Date: 4
Plan Prescribed Dose Per Fraction: 8 Gy
Plan Total Fractions Prescribed: 5
Plan Total Prescribed Dose: 40 Gy
Reference Point Dosage Given to Date: 32 Gy
Reference Point Session Dosage Given: 8 Gy
Session Number: 4

## 2023-08-02 ENCOUNTER — Ambulatory Visit
Admission: RE | Admit: 2023-08-02 | Discharge: 2023-08-02 | Disposition: A | Discharge status: Home / Self Care | Payer: PRIVATE HEALTH INSURANCE | Source: Ambulatory Visit | Attending: Radiation Oncology | Admitting: Radiation Oncology

## 2023-08-02 ENCOUNTER — Other Ambulatory Visit: Payer: Self-pay

## 2023-08-02 DIAGNOSIS — Z510 Encounter for antineoplastic radiation therapy: Secondary | ICD-10-CM | POA: Diagnosis not present

## 2023-08-02 DIAGNOSIS — C61 Malignant neoplasm of prostate: Secondary | ICD-10-CM

## 2023-08-02 LAB — RAD ONC ARIA SESSION SUMMARY
Course Elapsed Days: 9
Plan Fractions Treated to Date: 5
Plan Prescribed Dose Per Fraction: 8 Gy
Plan Total Fractions Prescribed: 5
Plan Total Prescribed Dose: 40 Gy
Reference Point Dosage Given to Date: 40 Gy
Reference Point Session Dosage Given: 8 Gy
Session Number: 5

## 2023-08-05 NOTE — Radiation Completion Notes (Addendum)
  Radiation Oncology         [phone] ________________________________  Name: Levi Cervantes MRN: [id number]  Date: 08/02/2023  DOB: October 25, 1931  Referring Physician: LELON GENTRY, M.D. Date of Service: 2023-08-05 Radiation Oncologist: Adina Barge, M.D. 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   Intent: Curative     ==========DELIVERED PLANS==========  First Treatment Date: 2023-07-24 Last Treatment Date: 2023-08-02   Plan Name: Spine_L2_SBRT Site: Lumbar Spine Technique: SBRT/SRT-IMRT Mode: Photon Dose Per Fraction: 8 Gy Prescribed Dose (Delivered / Prescribed): 40 Gy / 40 Gy Prescribed Fxs (Delivered / Prescribed): 5 / 5     ==========ON TREATMENT VISIT DATES========== 2023-07-24, 2023-07-26, 2023-07-29, 2023-07-31, 2023-08-02, 2023-08-02   See weekly On Treatment Notes in Epic for details in the Media tab (listed as Progress notes on the On Treatment Visit Dates listed above).  The patient tolerated the treatments well with only modest fatigue.  The patient will receive a call in about one month from the radiation oncology department. He will continue follow up with his urologist, Dr. Renda, as well.  ------------------------------------------------   Donnice Barge, MD [facility]  Radiation Oncology Direct Dial: [phone]  Fax: [phone] [email or web address]  Skype  LinkedIn

## 2023-08-28 NOTE — Progress Notes (Signed)
  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 (as documented in provider EOT note)  The patient was available for call today.  The patient did note mild fatigue during radiation. The patient did not note skin changes in the field of radiation during therapy. The patient has noticed improvement in pain in the area(s) treated with radiation. The patient is not taking dexamethasone . The patient does not have symptoms of  weakness or loss of control of the extremities. The patient does not have symptoms of headache. The patient does not have symptoms of seizure or uncontrolled movement. The patient does not have symptoms of changes in vision. The patient does not have changes in speech. The patient does not have confusion.   The patient is scheduled for ongoing care with Dr. Renda in medical oncology. The patient was encouraged to call if he  develops concerns or questions regarding radiation.   This concludes the interaction.  Rosaline Minerva, LPN

## 2023-09-03 ENCOUNTER — Ambulatory Visit
Admission: RE | Admit: 2023-09-03 | Discharge: 2023-09-03 | Disposition: A | Discharge status: Home / Self Care | Payer: PRIVATE HEALTH INSURANCE | Source: Ambulatory Visit | Attending: Radiation Oncology | Admitting: Radiation Oncology

## 2023-09-03 DIAGNOSIS — Z510 Encounter for antineoplastic radiation therapy: Secondary | ICD-10-CM | POA: Insufficient documentation

## 2023-09-03 DIAGNOSIS — C61 Malignant neoplasm of prostate: Secondary | ICD-10-CM | POA: Insufficient documentation

## 2023-09-03 DIAGNOSIS — C7951 Secondary malignant neoplasm of bone: Secondary | ICD-10-CM | POA: Insufficient documentation

## 2023-09-04 ENCOUNTER — Other Ambulatory Visit (HOSPITAL_BASED_OUTPATIENT_CLINIC_OR_DEPARTMENT_OTHER): Payer: Self-pay | Admitting: Urology

## 2023-09-04 DIAGNOSIS — M8589 Other specified disorders of bone density and structure, multiple sites: Secondary | ICD-10-CM

## 2024-01-08 ENCOUNTER — Other Ambulatory Visit

## 2024-01-10 ENCOUNTER — Other Ambulatory Visit

## 2024-01-10 DIAGNOSIS — M8589 Other specified disorders of bone density and structure, multiple sites: Secondary | ICD-10-CM | POA: Diagnosis not present

## 2024-01-10 DIAGNOSIS — M8580 Other specified disorders of bone density and structure, unspecified site: Secondary | ICD-10-CM
